# Patient Record
Sex: Female | Born: 2000 | Race: White | Hispanic: No | Marital: Single | Smoking: Former smoker
Health system: Southern US, Community
[De-identification: ages and names within clinical notes are randomized; demographics above are authoritative.]

## PROBLEM LIST (undated history)

## (undated) DIAGNOSIS — Z8669 Personal history of other diseases of the nervous system and sense organs: Secondary | ICD-10-CM

## (undated) DIAGNOSIS — R51 Headache: Secondary | ICD-10-CM

## (undated) DIAGNOSIS — R519 Headache, unspecified: Secondary | ICD-10-CM

## (undated) DIAGNOSIS — R569 Unspecified convulsions: Secondary | ICD-10-CM

## (undated) HISTORY — DX: Personal history of other diseases of the nervous system and sense organs: Z86.69

## (undated) HISTORY — PX: NO PAST SURGERIES: SHX2092

---
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
## 2018-01-04 ENCOUNTER — Other Ambulatory Visit: Payer: Self-pay | Admitting: Advanced Practice Midwife

## 2018-01-04 DIAGNOSIS — Z369 Encounter for antenatal screening, unspecified: Secondary | ICD-10-CM

## 2018-01-04 LAB — OB RESULTS CONSOLE HIV ANTIBODY (ROUTINE TESTING): HIV: NONREACTIVE

## 2018-01-05 LAB — OB RESULTS CONSOLE HEPATITIS B SURFACE ANTIGEN: Hepatitis B Surface Ag: NEGATIVE

## 2018-01-05 LAB — OB RESULTS CONSOLE RPR: RPR: NONREACTIVE

## 2018-01-26 ENCOUNTER — Ambulatory Visit
Admission: RE | Admit: 2018-01-26 | Discharge: 2018-01-26 | Disposition: A | Discharge status: Home / Self Care | Payer: Medicaid Other | Source: Ambulatory Visit | Attending: Maternal & Fetal Medicine | Admitting: Maternal & Fetal Medicine

## 2018-01-26 ENCOUNTER — Ambulatory Visit (HOSPITAL_BASED_OUTPATIENT_CLINIC_OR_DEPARTMENT_OTHER)
Admission: RE | Admit: 2018-01-26 | Discharge: 2018-01-26 | Disposition: A | Discharge status: Home / Self Care | Payer: Medicaid Other | Source: Ambulatory Visit | Attending: Maternal & Fetal Medicine | Admitting: Maternal & Fetal Medicine

## 2018-01-26 VITALS — BP 120/78 | HR 89 | Temp 98.3°F | Resp 18 | Wt 116.4 lb

## 2018-01-26 DIAGNOSIS — Z818 Family history of other mental and behavioral disorders: Secondary | ICD-10-CM | POA: Diagnosis not present

## 2018-01-26 DIAGNOSIS — Z369 Encounter for antenatal screening, unspecified: Secondary | ICD-10-CM

## 2018-01-26 DIAGNOSIS — Z3689 Encounter for other specified antenatal screening: Secondary | ICD-10-CM | POA: Diagnosis not present

## 2018-01-26 DIAGNOSIS — Z8669 Personal history of other diseases of the nervous system and sense organs: Secondary | ICD-10-CM

## 2018-01-26 DIAGNOSIS — Z3A11 11 weeks gestation of pregnancy: Secondary | ICD-10-CM | POA: Insufficient documentation

## 2018-01-26 NOTE — Progress Notes (Addendum)
Referring physician:  [facility] Length of Consultation: 40 minutes   Ms. Kinkade  was referred to [facility] for genetic counseling to review prenatal screening and testing options.  We also discussed her history of seizures and the family history of autism.  This note summarizes the information we discussed.    We offered the following routine screening tests for this pregnancy:  First trimester screening, which includes nuchal translucency ultrasound screen and first trimester maternal serum marker screening.  The nuchal translucency has approximately an 80% detection rate for Down syndrome and can be positive for other chromosome abnormalities as well as congenital heart defects.  When combined with a maternal serum marker screening, the detection rate is up to 90% for Down syndrome and up to 97% for trisomy 18.     Maternal serum marker screening, a blood test that measures pregnancy proteins, can provide risk assessments for Down syndrome, trisomy 18, and open neural tube defects (spina bifida, anencephaly). Because it does not directly examine the fetus, it cannot positively diagnose or rule out these problems.  Targeted ultrasound uses high frequency sound waves to create an image of the developing fetus.  An ultrasound is often recommended as a routine means of evaluating the pregnancy.  It is also used to screen for fetal anatomy problems (for example, a heart defect) that might be suggestive of a chromosomal or other abnormality.   Should these screening tests indicate an increased concern, then the following additional testing options would be offered:  The chorionic villus sampling procedure is available for first trimester chromosome analysis.  This involves the withdrawal of a small amount of chorionic villi (tissue from the developing placenta).  Risk of pregnancy loss is estimated to be approximately 1 in 200 to 1 in 100 (0.5 to  1%).  There is approximately a 1% (1 in 100) chance that the CVS chromosome results will be unclear.  Chorionic villi cannot be tested for neural tube defects.     Amniocentesis involves the removal of a small amount of amniotic fluid from the sac surrounding the fetus with the use of a thin needle inserted through the maternal abdomen and uterus.  Ultrasound guidance is used throughout the procedure.  Fetal cells from amniotic fluid are directly evaluated and > 99.5% of chromosome problems and > 98% of open neural tube defects can be detected. This procedure is generally performed after the 15th week of pregnancy.  The main risks to this procedure include complications leading to miscarriage in less than 1 in 200 cases (0.5%).  As another option for information if the pregnancy is suspected to be an an increased chance for certain chromosome conditions, we also reviewed the availability of cell free fetal DNA testing from maternal blood to determine whether or not the baby may have either Down syndrome, trisomy 4, or trisomy 73.  This test utilizes a maternal blood sample and DNA sequencing technology to isolate circulating cell free fetal DNA from maternal plasma.  The fetal DNA can then be analyzed for DNA sequences that are derived from the three most common chromosomes involved in aneuploidy, chromosomes 13, 18, and 21.  If the overall amount of DNA is greater than the expected level for any of these chromosomes, aneuploidy is suspected.  While we do not consider it a replacement for invasive testing and karyotype analysis, a negative result from this testing would be reassuring, though not a guarantee of a normal chromosome complement for  the baby.  An abnormal result is certainly suggestive of an abnormal chromosome complement, though we would still recommend CVS or amniocentesis to confirm any findings from this testing.  Cystic Fibrosis and Spinal Muscular Atrophy (SMA) screening were also discussed  with the patient. Both conditions are recessive, which means that both parents must be carriers in order to have a child with the disease.  Cystic fibrosis (CF) is one of the most common genetic conditions in persons of Caucasian ancestry.  This condition occurs in approximately 1 in 2,500 Caucasian persons and results in thickened secretions in the lungs, digestive, and reproductive systems.  For a baby to be at risk for having CF, both of the parents must be carriers for this condition.  Approximately 1 in 90 Caucasian persons is a carrier for CF.  Current carrier testing looks for the most common mutations in the gene for CF and can detect approximately 90% of carriers in the Caucasian population.  This means that the carrier screening can greatly reduce, but cannot eliminate, the chance for an individual to have a child with CF.  If an individual is found to be a carrier for CF, then carrier testing would be available for the partner. As part of [place]'s newborn screening profile, all babies born in the state of [place] will have a two-tier screening process.  Specimens are first tested to determine the concentration of immunoreactive trypsinogen (IRT).  The top 5% of specimens with the highest IRT values then undergo DNA testing using a panel of over 40 common CF mutations. SMA is a neurodegenerative disorder that leads to atrophy of skeletal muscle and overall weakness.  This condition is also more prevalent in the Caucasian population, with 1 in 40-1 in 60 persons being a carrier and 1 in 6,000-1 in 10,000 children being affected.  There are multiple forms of the disease, with some causing death in infancy to other forms with survival into adulthood.  The genetics of SMA is complex, but carrier screening can detect up to 95% of carriers in the Caucasian population.  Similar to CF, a negative result can greatly reduce, but cannot eliminate, the chance to have a child with SMA. Both the patient  and her partner are of mixed African American and Caucasian background.  Prior hemoglobinopathy screening was performed at [facility] and was normal.  We obtained a detailed family history and pregnancy history.  The patient reported one paternal first cousin (her aunt's daughter) with autism.  She is 19 years old and has no other health conditions or birth defects that were reported.  The term autism may include a spectrum of disorders from Asperger syndrome (with mild features) to more severe autistic features and developmental disabilities.  There may be many different causes for autism, some of which are inherited and others that are not well understood.  In order to determine the chance for this pregnancy or other family members to have a similar condition, one must first determine the cause for the condition in the affected family member.  When the specific cause is not known, this risk assessment is difficult.  Among the inherited causes for autism spectrum disorders, developmental delay or intellectual disabilities are single gene disorders, chromosome conditions, numerous syndromes and multifactorial conditions.  Each of these may have a different recurrence risk within a family.  Because there is currently no identified diagnosis in this cousin, we also discussed the availability of carrier screening for Fragile X syndrome.  Fragile X  syndrome is the most common inherited cause for mental intellectual disabilities in males and has autism spectrum disorders as a common feature.  Fragile X syndrome is caused by a change, or expansion, of the DNA in the gene FMR1.  Carrier screening is used to determine the number of repeats in this region of the gene and to determine the likelihood of having a child with this condition.  A negative result would mean that the individual undergoing carrier testing is not at risk for having a child with Fragile X, but cannot exclude other causes for autism or mental retardation.   In some situations, results from this testing may be inconclusive.  Ms. Earlene PlaterDavis declined this screening at this time.  If more is learned about this cousin and her diagnosis, we are happy to discuss this further.    The patient also reported a personal history of seizures as a child.  Her OB referred her to a neurologist for evaluation, but she did not go to that visit because she was afraid of possible testing that they may do that may hurt the baby.  I encouraged her to reschedule that visit and to go, with reassurance that they would not perform testing that would be harmful to the baby. Based upon her description, Ms. Earlene PlaterDavis had seizures where she "blacked out" as a child only when she got "overheated".  It is unclear if this means febrile seizures, which are not an uncommon condition, or if this is something different.  Of note, her mother is said to have had grand mal seizures many years ago and her maternal grandmother is also said to have had seizures, though no details are known.  None of them was known to be on medication to control seizures.  None had any neurocutaneous markers suggestive of a genetic syndrome associated with seizures.  We discussed that it is difficult to determine the chance for her child to have seizures, particularly because we do not know the cause for them in her and her family members.  However, given the reported family history, we would encourage her to make her pediatrician aware of this history, as this baby may be at increased risk for seizures.  The visit to the neurologist may help provide additional information. The remainder of the family history was reported to be unremarkable for birth defects, intellectual delays, recurrent pregnancy loss or known chromosome abnormalities.  Ms. Earlene PlaterDavis stated that this is her first pregnancy.  She reported no complications or exposures that would be expected to increase the risk for birth defects.  After consideration of the options,  Ms. Earlene PlaterDavis elected to proceed with an ultrasound only.  She declined first trimester aneuploidy screening as well as carrier screening for CF and SMA.  An ultrasound was performed at the time of the visit.  The gestational age was consistent with 11weeks 6 days.  Fetal anatomy could not be assessed due to early gestational age.  Please refer to the ultrasound report for details of that study.  Ms. Earlene PlaterDavis was encouraged to call with questions or concerns.  We can be contacted at [phone].  Labs ordered: none  Cherly Andersoneborah F. Wells, MS, CGC  I was immediately available and supervising. Argentina PonderAndra H. Sophina Mitten, MD [facility]

## 2018-03-08 DIAGNOSIS — Z87898 Personal history of other specified conditions: Secondary | ICD-10-CM | POA: Insufficient documentation

## 2018-03-13 ENCOUNTER — Other Ambulatory Visit: Payer: Self-pay | Admitting: *Deleted

## 2018-03-13 DIAGNOSIS — O99332 Smoking (tobacco) complicating pregnancy, second trimester: Secondary | ICD-10-CM

## 2018-03-16 ENCOUNTER — Ambulatory Visit
Admission: RE | Admit: 2018-03-16 | Discharge: 2018-03-16 | Disposition: A | Discharge status: Home / Self Care | Payer: Medicaid Other | Source: Ambulatory Visit | Attending: Maternal & Fetal Medicine | Admitting: Maternal & Fetal Medicine

## 2018-03-16 DIAGNOSIS — O99332 Smoking (tobacco) complicating pregnancy, second trimester: Secondary | ICD-10-CM

## 2018-04-06 ENCOUNTER — Other Ambulatory Visit: Payer: Self-pay | Admitting: Obstetrics and Gynecology

## 2018-04-06 DIAGNOSIS — F17200 Nicotine dependence, unspecified, uncomplicated: Secondary | ICD-10-CM

## 2018-04-06 DIAGNOSIS — Z87898 Personal history of other specified conditions: Secondary | ICD-10-CM

## 2018-04-10 ENCOUNTER — Ambulatory Visit
Admission: RE | Admit: 2018-04-10 | Discharge: 2018-04-10 | Disposition: A | Discharge status: Home / Self Care | Payer: Medicaid Other | Source: Ambulatory Visit | Attending: Obstetrics and Gynecology | Admitting: Obstetrics and Gynecology

## 2018-04-10 VITALS — BP 108/77 | HR 87 | Temp 97.7°F | Resp 18 | Wt 138.8 lb

## 2018-04-10 DIAGNOSIS — Z3A22 22 weeks gestation of pregnancy: Secondary | ICD-10-CM | POA: Diagnosis not present

## 2018-04-10 DIAGNOSIS — O99332 Smoking (tobacco) complicating pregnancy, second trimester: Secondary | ICD-10-CM | POA: Diagnosis present

## 2018-04-10 DIAGNOSIS — Z369 Encounter for antenatal screening, unspecified: Secondary | ICD-10-CM

## 2018-04-10 NOTE — Progress Notes (Signed)
Fetus measures small at 22 weeks lagging 8 days  F/u growth ordered in 3-4 weeks

## 2018-04-27 ENCOUNTER — Other Ambulatory Visit: Payer: Self-pay | Admitting: Obstetrics and Gynecology

## 2018-04-27 DIAGNOSIS — Z09 Encounter for follow-up examination after completed treatment for conditions other than malignant neoplasm: Secondary | ICD-10-CM

## 2018-04-27 DIAGNOSIS — Z3689 Encounter for other specified antenatal screening: Secondary | ICD-10-CM

## 2018-05-01 ENCOUNTER — Ambulatory Visit
Admission: RE | Admit: 2018-05-01 | Discharge: 2018-05-01 | Disposition: A | Discharge status: Home / Self Care | Payer: Medicaid Other | Source: Ambulatory Visit | Attending: Maternal & Fetal Medicine | Admitting: Maternal & Fetal Medicine

## 2018-05-01 DIAGNOSIS — Z3689 Encounter for other specified antenatal screening: Secondary | ICD-10-CM

## 2018-05-01 DIAGNOSIS — Z09 Encounter for follow-up examination after completed treatment for conditions other than malignant neoplasm: Secondary | ICD-10-CM

## 2018-05-01 HISTORY — DX: Headache: R51

## 2018-05-01 HISTORY — DX: Headache, unspecified: R51.9

## 2018-05-18 ENCOUNTER — Other Ambulatory Visit: Payer: Self-pay

## 2018-05-18 DIAGNOSIS — F17200 Nicotine dependence, unspecified, uncomplicated: Secondary | ICD-10-CM

## 2018-05-18 DIAGNOSIS — Z87898 Personal history of other specified conditions: Secondary | ICD-10-CM

## 2018-05-22 ENCOUNTER — Ambulatory Visit
Admission: RE | Admit: 2018-05-22 | Discharge: 2018-05-22 | Disposition: A | Discharge status: Home / Self Care | Payer: Medicaid Other | Source: Ambulatory Visit | Attending: Obstetrics & Gynecology | Admitting: Obstetrics & Gynecology

## 2018-05-22 DIAGNOSIS — Z3A28 28 weeks gestation of pregnancy: Secondary | ICD-10-CM | POA: Insufficient documentation

## 2018-05-22 DIAGNOSIS — Z87898 Personal history of other specified conditions: Secondary | ICD-10-CM | POA: Insufficient documentation

## 2018-05-22 DIAGNOSIS — O99333 Smoking (tobacco) complicating pregnancy, third trimester: Secondary | ICD-10-CM | POA: Insufficient documentation

## 2018-05-22 DIAGNOSIS — Z3689 Encounter for other specified antenatal screening: Secondary | ICD-10-CM | POA: Diagnosis not present

## 2018-05-22 DIAGNOSIS — F17200 Nicotine dependence, unspecified, uncomplicated: Secondary | ICD-10-CM | POA: Diagnosis not present

## 2018-05-26 LAB — HM HIV SCREENING LAB: HM HIV Screening: NEGATIVE

## 2018-06-15 ENCOUNTER — Other Ambulatory Visit: Payer: Self-pay | Admitting: Obstetrics and Gynecology

## 2018-06-15 DIAGNOSIS — F17200 Nicotine dependence, unspecified, uncomplicated: Secondary | ICD-10-CM

## 2018-06-15 DIAGNOSIS — Z87898 Personal history of other specified conditions: Secondary | ICD-10-CM

## 2018-06-19 ENCOUNTER — Ambulatory Visit
Admission: RE | Admit: 2018-06-19 | Discharge: 2018-06-19 | Disposition: A | Discharge status: Home / Self Care | Payer: Medicaid Other | Source: Ambulatory Visit | Attending: Obstetrics & Gynecology | Admitting: Obstetrics & Gynecology

## 2018-06-19 DIAGNOSIS — Z3A32 32 weeks gestation of pregnancy: Secondary | ICD-10-CM | POA: Diagnosis not present

## 2018-06-19 DIAGNOSIS — Z87898 Personal history of other specified conditions: Secondary | ICD-10-CM | POA: Insufficient documentation

## 2018-06-19 DIAGNOSIS — F17200 Nicotine dependence, unspecified, uncomplicated: Secondary | ICD-10-CM | POA: Insufficient documentation

## 2018-07-16 LAB — OB RESULTS CONSOLE GBS: GBS: POSITIVE

## 2018-08-02 NOTE — L&D Delivery Note (Signed)
Delivery Note At 8:39 PM a viable female was delivered via Vaginal, Spontaneous (Presentation: ROA).  APGAR: 8, 9; weight pending .   Placenta status: delivered spontaneously, intact.  Cord: 3VC, with the following complications: none.  Cord pH: n/a  Anesthesia:  none Episiotomy: None Lacerations: 1st degree Suture Repair: 3.0 vicryl Est. Blood Loss (mL): 450  Mom to postpartum.  Baby to Couplet care / Skin to Skin.  Called to see patient.  Mom pushed to deliver a viable female infant.  The head followed by shoulders, which delivered without difficulty, and the rest of the body.  No nuchal cord noted.  Baby to mom's chest.  Cord clamped and cut after > 1 min delay.  Cord blood obtained.  Placenta delivered spontaneously, intact, with a 3-vessel cord.  First degree vaginal laceration repaired with 3-0 Vicryl in standard fashion, mainly to obtain hemostasis.  All counts correct.  Hemostasis obtained with IV pitocin and fundal massage. EBL 450 mL.     Thomasene Mohair, MD 08/17/2018, 9:04 PM

## 2018-08-04 ENCOUNTER — Other Ambulatory Visit: Payer: Self-pay | Admitting: Obstetrics & Gynecology

## 2018-08-17 ENCOUNTER — Inpatient Hospital Stay
Admission: RE | Admit: 2018-08-17 | Discharge: 2018-08-19 | DRG: 807 | Disposition: A | Discharge status: Home / Self Care | Payer: Medicaid Other | Attending: Obstetrics and Gynecology | Admitting: Obstetrics and Gynecology

## 2018-08-17 ENCOUNTER — Other Ambulatory Visit: Payer: Self-pay

## 2018-08-17 DIAGNOSIS — O480 Post-term pregnancy: Secondary | ICD-10-CM

## 2018-08-17 DIAGNOSIS — O99824 Streptococcus B carrier state complicating childbirth: Secondary | ICD-10-CM | POA: Diagnosis not present

## 2018-08-17 DIAGNOSIS — Z3490 Encounter for supervision of normal pregnancy, unspecified, unspecified trimester: Secondary | ICD-10-CM | POA: Diagnosis present

## 2018-08-17 DIAGNOSIS — Z3A40 40 weeks gestation of pregnancy: Secondary | ICD-10-CM

## 2018-08-17 LAB — URINE DRUG SCREEN, QUALITATIVE (ARMC ONLY)
Amphetamines, Ur Screen: NOT DETECTED
BENZODIAZEPINE, UR SCRN: NOT DETECTED
Barbiturates, Ur Screen: NOT DETECTED
Cannabinoid 50 Ng, Ur ~~LOC~~: NOT DETECTED
Cocaine Metabolite,Ur ~~LOC~~: NOT DETECTED
MDMA (Ecstasy)Ur Screen: NOT DETECTED
Methadone Scn, Ur: NOT DETECTED
Opiate, Ur Screen: NOT DETECTED
Phencyclidine (PCP) Ur S: NOT DETECTED
TRICYCLIC, UR SCREEN: NOT DETECTED

## 2018-08-17 LAB — CBC
HCT: 38.9 % (ref 36.0–46.0)
Hemoglobin: 12.9 g/dL (ref 12.0–15.0)
MCH: 29.3 pg (ref 26.0–34.0)
MCHC: 33.2 g/dL (ref 30.0–36.0)
MCV: 88.4 fL (ref 80.0–100.0)
Platelets: 314 10*3/uL (ref 150–400)
RBC: 4.4 MIL/uL (ref 3.87–5.11)
RDW: 12.1 % (ref 11.5–15.5)
WBC: 12.9 10*3/uL — ABNORMAL HIGH (ref 4.0–10.5)
nRBC: 0 % (ref 0.0–0.2)

## 2018-08-17 LAB — TYPE AND SCREEN
ABO/RH(D): O POS
ANTIBODY SCREEN: NEGATIVE

## 2018-08-17 MED ORDER — LIDOCAINE HCL (PF) 1 % IJ SOLN
INTRAMUSCULAR | Status: AC
  Administered 2018-08-17: 30 mL
  Filled 2018-08-17: qty 30

## 2018-08-17 MED ORDER — DIPHENHYDRAMINE HCL 25 MG PO CAPS: 25.0000 mg | ORAL_CAPSULE | Freq: Four times a day (QID) | ORAL | Status: DC | PRN

## 2018-08-17 MED ORDER — OXYTOCIN 10 UNIT/ML IJ SOLN
INTRAMUSCULAR | Status: AC
  Filled 2018-08-17: qty 2

## 2018-08-17 MED ORDER — SENNOSIDES-DOCUSATE SODIUM 8.6-50 MG PO TABS
2.0000 | ORAL_TABLET | ORAL | Status: DC
  Administered 2018-08-18 – 2018-08-19 (×2): 2 via ORAL
  Filled 2018-08-17 (×2): qty 2

## 2018-08-17 MED ORDER — ACETAMINOPHEN 325 MG PO TABS: 650.0000 mg | ORAL_TABLET | ORAL | Status: DC | PRN

## 2018-08-17 MED ORDER — BUPIVACAINE LIPOSOME 1.3 % IJ SUSP
INTRAMUSCULAR | Status: AC
  Filled 2018-08-17: qty 20

## 2018-08-17 MED ORDER — CEFAZOLIN SODIUM-DEXTROSE 2-4 GM/100ML-% IV SOLN
2.0000 g | Freq: Once | INTRAVENOUS | Status: DC
  Filled 2018-08-17: qty 100

## 2018-08-17 MED ORDER — BUPIVACAINE HCL (PF) 0.5 % IJ SOLN
INTRAMUSCULAR | Status: AC
  Filled 2018-08-17: qty 30

## 2018-08-17 MED ORDER — BUTORPHANOL TARTRATE 1 MG/ML IJ SOLN
1.0000 mg | INTRAMUSCULAR | Status: DC | PRN
  Administered 2018-08-17 (×2): 1 mg via INTRAVENOUS
  Filled 2018-08-17 (×2): qty 1

## 2018-08-17 MED ORDER — SIMETHICONE 80 MG PO CHEW: 80.0000 mg | CHEWABLE_TABLET | ORAL | Status: DC | PRN

## 2018-08-17 MED ORDER — OXYTOCIN 40 UNITS IN NORMAL SALINE INFUSION - SIMPLE MED
INTRAVENOUS | Status: AC
  Administered 2018-08-17: 500 mL via INTRAVENOUS
  Filled 2018-08-17: qty 1000

## 2018-08-17 MED ORDER — SOD CITRATE-CITRIC ACID 500-334 MG/5ML PO SOLN
ORAL | Status: AC
  Filled 2018-08-17: qty 15

## 2018-08-17 MED ORDER — HYDROCODONE-ACETAMINOPHEN 5-325 MG PO TABS: 1.0000 | ORAL_TABLET | Freq: Four times a day (QID) | ORAL | Status: DC | PRN

## 2018-08-17 MED ORDER — MISOPROSTOL 200 MCG PO TABS
ORAL_TABLET | ORAL | Status: AC
  Filled 2018-08-17: qty 4

## 2018-08-17 MED ORDER — DIBUCAINE 1 % RE OINT: 1.0000 "application " | TOPICAL_OINTMENT | RECTAL | Status: DC | PRN

## 2018-08-17 MED ORDER — MISOPROSTOL 25 MCG QUARTER TABLET
25.0000 ug | ORAL_TABLET | ORAL | Status: DC | PRN
  Administered 2018-08-17 (×2): 25 ug via VAGINAL
  Filled 2018-08-17 (×3): qty 1

## 2018-08-17 MED ORDER — COCONUT OIL OIL: 1.0000 "application " | TOPICAL_OIL | Status: DC | PRN

## 2018-08-17 MED ORDER — ONDANSETRON HCL 4 MG/2ML IJ SOLN: 4.0000 mg | INTRAMUSCULAR | Status: DC | PRN

## 2018-08-17 MED ORDER — CEFAZOLIN (ANCEF) 1 G IV SOLR
2.0000 g | INTRAVENOUS | Status: DC
  Filled 2018-08-17: qty 2

## 2018-08-17 MED ORDER — BENZOCAINE-MENTHOL 20-0.5 % EX AERO: 1.0000 "application " | INHALATION_SPRAY | CUTANEOUS | Status: DC | PRN

## 2018-08-17 MED ORDER — SODIUM CHLORIDE 0.9 % IV SOLN
5.0000 10*6.[IU] | Freq: Once | INTRAVENOUS | Status: AC
  Administered 2018-08-17: 5 10*6.[IU] via INTRAVENOUS
  Filled 2018-08-17: qty 5

## 2018-08-17 MED ORDER — IBUPROFEN 600 MG PO TABS
600.0000 mg | ORAL_TABLET | Freq: Four times a day (QID) | ORAL | Status: DC
  Administered 2018-08-18 – 2018-08-19 (×6): 600 mg via ORAL
  Filled 2018-08-17 (×6): qty 1

## 2018-08-17 MED ORDER — OXYTOCIN BOLUS FROM INFUSION
500.0000 mL | Freq: Once | INTRAVENOUS | Status: AC
  Administered 2018-08-17: 500 mL via INTRAVENOUS

## 2018-08-17 MED ORDER — LACTATED RINGERS IV SOLN
500.0000 mL | INTRAVENOUS | Status: DC | PRN
  Administered 2018-08-17: 500 mL via INTRAVENOUS

## 2018-08-17 MED ORDER — FAMOTIDINE 20 MG PO TABS
20.0000 mg | ORAL_TABLET | Freq: Two times a day (BID) | ORAL | Status: DC
  Administered 2018-08-17: 20 mg via ORAL
  Filled 2018-08-17: qty 1

## 2018-08-17 MED ORDER — BENZOCAINE-MENTHOL 20-0.5 % EX AERO
INHALATION_SPRAY | CUTANEOUS | Status: AC
  Filled 2018-08-17: qty 56

## 2018-08-17 MED ORDER — FERROUS SULFATE 325 (65 FE) MG PO TABS
325.0000 mg | ORAL_TABLET | Freq: Two times a day (BID) | ORAL | Status: DC
  Administered 2018-08-18 – 2018-08-19 (×3): 325 mg via ORAL
  Filled 2018-08-17 (×3): qty 1

## 2018-08-17 MED ORDER — TERBUTALINE SULFATE 1 MG/ML IJ SOLN
0.2500 mg | Freq: Once | INTRAMUSCULAR | Status: AC | PRN
  Administered 2018-08-17: 0.25 mg via SUBCUTANEOUS
  Filled 2018-08-17: qty 1

## 2018-08-17 MED ORDER — WITCH HAZEL-GLYCERIN EX PADS: 1.0000 "application " | MEDICATED_PAD | CUTANEOUS | Status: DC | PRN

## 2018-08-17 MED ORDER — ONDANSETRON HCL 4 MG/2ML IJ SOLN: 4.0000 mg | Freq: Four times a day (QID) | INTRAMUSCULAR | Status: DC | PRN

## 2018-08-17 MED ORDER — OXYTOCIN 40 UNITS IN NORMAL SALINE INFUSION - SIMPLE MED
2.5000 [IU]/h | INTRAVENOUS | Status: DC
  Administered 2018-08-18: 2.5 [IU]/h via INTRAVENOUS
  Filled 2018-08-17: qty 1000

## 2018-08-17 MED ORDER — ONDANSETRON HCL 4 MG PO TABS: 4.0000 mg | ORAL_TABLET | ORAL | Status: DC | PRN

## 2018-08-17 MED ORDER — PENICILLIN G 3 MILLION UNITS IVPB - SIMPLE MED
3.0000 10*6.[IU] | INTRAVENOUS | Status: DC
  Filled 2018-08-17 (×7): qty 100

## 2018-08-17 MED ORDER — AMMONIA AROMATIC IN INHA
RESPIRATORY_TRACT | Status: AC
  Filled 2018-08-17: qty 10

## 2018-08-17 MED ORDER — LACTATED RINGERS IV SOLN
INTRAVENOUS | Status: DC
  Administered 2018-08-17 (×4): via INTRAVENOUS

## 2018-08-17 MED ORDER — PRENATAL MULTIVITAMIN CH
1.0000 | ORAL_TABLET | Freq: Every day | ORAL | Status: DC
  Administered 2018-08-18 – 2018-08-19 (×2): 1 via ORAL
  Filled 2018-08-17 (×2): qty 1

## 2018-08-17 NOTE — Progress Notes (Signed)
Patient is in severe pain. Refusing vaginal examinations. Offered a epidural but does not believe this will help her pain. Baby is OP and she is having severe back pain. Offered an cesarean section for maternal intolerance to labor, remote from delivery.  Discussed risks benefits in detail with patient and her mother. Patient desires a cesarean section.   Adelene Idler MD [facility], [facility] 08/17/2018 8:02 PM

## 2018-08-17 NOTE — Discharge Summary (Signed)
OB Discharge Summary     Patient Name: Charlene Combs DOB: 09/20/2000 MRN: [id number]  Date of admission: 08/17/2018 Delivering MD: Stephen Jackson, MD  Date of Delivery: 08/17/2018  Date of discharge: 08/19/2018 Admitting diagnosis: Induction of labor Intrauterine pregnancy: [redacted]w[redacted]d     Secondary diagnosis: None     Discharge diagnosis: Term Pregnancy Delivered                                                                                                Post partum procedures:none  Augmentation: Cytotec  Complications: None  Hospital course:  Induction of Labor With Vaginal Delivery   18 y.o. yo G1P0 at [redacted]w[redacted]d was admitted to the hospital 08/17/2018 for induction of labor.  Indication for induction: Postdates.  Patient had an uncomplicated labor course as follows: Membrane Rupture Time/Date: 12:35 PM ,08/17/2018   Intrapartum Procedures: Episiotomy: None [1]                                         Lacerations:  1st degree [2]  Patient had delivery of a Viable infant.  Information for the patient's newborn:  Mosely, Girl Sherrice [[id number]]  Delivery Method: Vag-Spont   08/17/2018  Details of delivery can be found in separate delivery note.  Patient had a routine postpartum course. Patient is discharged home 08/19/18.  Physical exam  Vitals:   08/18/18 1521 08/18/18 2004 08/19/18 0059 08/19/18 0718  BP: 122/72 109/67 120/81 114/78  Pulse: 80 91 96 87  Resp: 20 20 20 18  Temp: 97.9 F (36.6 C) 98.3 F (36.8 C) 98.3 F (36.8 C) 98.6 F (37 C)  TempSrc: Oral Oral Oral Oral  SpO2:  98% 98% 99%  Weight:      Height:       General: alert, cooperative and no distress Lochia: appropriate Uterine Fundus: firm/ at U/ ML/ NT Incision: Healing well with no significant drainage DVT Evaluation: No evidence of DVT seen on physical exam.  Labs: Lab Results  Component Value Date   WBC 19.7 (H) 08/18/2018   HGB 11.7 (L) 08/18/2018   HCT 35.9 (L) 08/18/2018   MCV 90.4 08/18/2018    PLT 293 08/18/2018  O POS/ Varivax x2/ MMR x2  Discharge instruction: per After Visit Summary.  Medications:  Allergies as of 08/19/2018   No Known Allergies     Medication List    TAKE these medications   prenatal multivitamin Tabs tablet Take 1 tablet by mouth daily at 12 noon.       Diet: routine diet  Activity: Advance as tolerated. Pelvic rest for 6 weeks.   Outpatient follow up: Follow-up Information    Department, [facility]. Schedule an appointment as soon as possible for a visit in 6 week(s).   Contact information: [address] [place] [address] [phone]             Postpartum contraception: Nexplanon Rhogam Given postpartum: no Rubella vaccine given postpartum: no Varicella vaccine given   postpartum: no TDaP given antepartum or postpartum: 05/26/2018  Newborn Data: Live born female  Birth Weight:  7#6.5oz APGAR: 8, 9  Newborn Delivery   Birth date/time:  08/17/2018 20:39:00 Delivery type:  Vaginal, Spontaneous      Baby Feeding: Bottle  Disposition:home with mother  SIGNED: Dalia Heading, CNM

## 2018-08-17 NOTE — H&P (Signed)
Obstetrics Admission History & Physical   Postdates Induction of Labor  HPI:  18 y.o. G1P0 @ [redacted]w[redacted]d (08/11/2018, Date entered prior to episode creation). Admitted on 08/17/2018:   Patient Active Problem List   Diagnosis Date Noted  . Post-dates pregnancy 08/17/2018  . size < dates  04/10/2018  . Family history of autism   . First trimester screening   . History of seizures as a child   . Elevated troponin 12/16/2014     Presents for post-dates induction of labor. She has not been having contractions, loss of fluid or vaginal bleeding. Endorses good fetal movement.  Prenatal care at: at [facility]. Pregnancy complicated by Group B strep. Early growth scan showed growth at 9%, last scan at 32 weeks 06/2018 growth was at the 40th percentile.  ROS: A review of systems was performed and negative, except as stated in the above HPI.  PMHx:  Past Medical History:  Diagnosis Date  . febrile seizures    At age 7  . Headache    Migraines   PSHx: History reviewed. No pertinent surgical history. Medications:  Medications Prior to Admission  Medication Sig Dispense Refill Last Dose  . Prenatal Vit-Fe Fumarate-FA (PRENATAL MULTIVITAMIN) TABS tablet Take 1 tablet by mouth daily at 12 noon.   Taking   Allergies: has No Known Allergies. OBHx:  OB History  Gravida Para Term Preterm AB Living  1         0  SAB TAB Ectopic Multiple Live Births               # Outcome Date GA Lbr Len/2nd Weight Sex Delivery Anes PTL Lv  1 Current            FHx: Negative/unremarkable except as detailed in HPI..  No family history of birth defects. Soc Hx: Never smoker, Alcohol: none and Recreational drug use: former (marijuana)  Objective:   Vitals:   08/17/18 0900  BP: 113/72  Pulse: (!) 110  Resp: 18  Temp: 98 F (36.7 C)   Constitutional: Well nourished, well developed female in no acute distress.  HEENT: normal Skin: Warm and dry.  Cardiovascular: Tachycardia, regular rhythm.   Extremity: trace  to 1+ bilateral pedal edema Respiratory: Clear to auscultation bilaterally. Normal respiratory effort Abdomen: gravid, non-tender Neuro: Cranial nerves grossly intact Psych: Alert and Oriented x3. No memory deficits. Normal mood and affect.  MS: normal gait, normal bilateral lower extremity ROM/strength/stability.  Pelvic exam: is not limited by body habitus External Genitalia, Bartholin's glands, Urethra, Skene's glands: within normal limits Vagina: within normal limits and with no blood in the vault Cervix: 0/30/-3 Uterus: Occasional contractions on tocometry   BSUS: Cephalic and normal fetal cardiac activity  EFM:FHR: 125 bpm, variability: moderate,  accelerations:  Present,  decelerations:  Absent Toco: Occasional contractions   Perinatal info:  Blood type: O positive Rubella - MMR vaccine x 2 Varicella - Varicella vaccine x 2 TDaP Given during third trimester of this pregnancy on 05/26/2018 RPR NR / HIV Neg/ HBsAg Neg   Assessment & Plan:   18 y.o. G1P0 @ [redacted]w[redacted]d, Admitted on 08/17/2018 for postdates induction of labor.    Admit for labor, Antibiotics for GBS prophylaxis, Observe for cervical change, Fetal Wellbeing Reassuring, Epidural when ready and AROM when Appropriate  Jacelyn Schmid, CNM [facility], [facility] 08/17/2018   

## 2018-08-18 LAB — CBC
HCT: 35.9 % — ABNORMAL LOW (ref 36.0–46.0)
HEMOGLOBIN: 11.7 g/dL — AB (ref 12.0–15.0)
MCH: 29.5 pg (ref 26.0–34.0)
MCHC: 32.6 g/dL (ref 30.0–36.0)
MCV: 90.4 fL (ref 80.0–100.0)
Platelets: 293 10*3/uL (ref 150–400)
RBC: 3.97 MIL/uL (ref 3.87–5.11)
RDW: 12.1 % (ref 11.5–15.5)
WBC: 19.7 10*3/uL — ABNORMAL HIGH (ref 4.0–10.5)
nRBC: 0 % (ref 0.0–0.2)

## 2018-08-18 LAB — RPR: RPR Ser Ql: REACTIVE — AB

## 2018-08-18 LAB — RPR, QUANT+TP ABS (REFLEX)
Rapid Plasma Reagin, Quant: 1:1 {titer} — ABNORMAL HIGH
T Pallidum Abs: NONREACTIVE

## 2018-08-18 MED ORDER — OXYTOCIN 40 UNITS IN NORMAL SALINE INFUSION - SIMPLE MED
INTRAVENOUS | Status: AC
  Filled 2018-08-18: qty 1000

## 2018-08-18 NOTE — Progress Notes (Signed)
Post Partum Day 1 Subjective: Doing well, no complaints.  Tolerating regular diet, pain with PO meds, voiding and ambulating without difficulty.  No CP SOB F/C N/V or leg pain No HA, change of vision, RUQ/epigastric pain  Objective: BP 110/74 (BP Location: Right Arm)   Pulse 100   Temp 98.2 F (36.8 C) (Oral)   Resp 18   Ht 5' 4"  (1.626 m)   Wt 78 kg   LMP 11/08/2017   SpO2 100%   BMI 29.52 kg/m    Physical Exam:  General: NAD CV: RRR Pulm: nl effort, CTABL Lochia: moderate Uterine Fundus: fundus firm and below umbilicus DVT Evaluation: no cords, ttp LEs   Recent Labs    08/17/18 0949 08/18/18 0652  HGB 12.9 11.7*  HCT 38.9 35.9*  WBC 12.9* 19.7*  PLT 314 293    Assessment/Plan: 18 y.o. G1P1001 postpartum day # 1  1. Continue routine postpartum care 2. O positive, MMR x2, Varivax x2 3. TDAP status given antepartum 4. Formula feeding/Contraception: Nexplanon 5. Disposition: discharge to home tomorrow   Rod Can, CNM

## 2018-08-19 NOTE — Clinical Social Work Note (Signed)
The CSW met with the patient's RN to discuss patient needs. As the patient and her infant are negative for all substances, CPS involvement in not indicated. The CSW provided a local resource list as the patient is new to parenting. The patient has adequate supports in the community including the FOB and her family. CSW is signing off. Please consult should needs arise.  Santiago Bumpers, MSW, Latanya Presser [phone]

## 2018-08-19 NOTE — Plan of Care (Signed)
Patient's vital signs stable; fundus firm; small amount rubra lochia; voiding; good appetite; good po fluids; bottle feeding infant with good technique observed; pain controlled with po motrin; fob at bedside and attentive; patient watched Period of Purple Crying DVD; good maternal-infant bonding observed.

## 2018-08-19 NOTE — Discharge Instructions (Signed)
Call your doctor for increased pain or vaginal bleeding, temperature above 100.4, depression, or concerns.  Continue prenatal vitamin and iron.  No strenuous activity or heavy lifting for 6 weeks.  No tub baths- showers only.  No intercourse, tampons, or douching for 6 weeks.  No driving for 2 weeks or while taking pain medications.

## 2018-08-19 NOTE — Progress Notes (Addendum)
Education provided on need for Influenza vaccine.  Pt declines vaccine at this time.  Pt states that she received TDaP vaccine during pregnancy.  Received MMR x2 and Varicella vaccine x2 per prenatal records, and declines these vaccines at this time.  Reed Breech, RN 08/19/2018 12:11 PM

## 2018-08-19 NOTE — Progress Notes (Signed)
Discharge instructions provided.  Pt and sig other verbalize understanding of all instructions and follow-up care.  Pt discharged to home with infant at 1436 on 08/19/18 via wheelchair by NT. Reynold Bowen, RN 08/19/2018 2:43 PM

## 2018-10-05 DIAGNOSIS — E663 Overweight: Secondary | ICD-10-CM | POA: Insufficient documentation

## 2019-03-20 DIAGNOSIS — Z8669 Personal history of other diseases of the nervous system and sense organs: Secondary | ICD-10-CM

## 2019-03-20 DIAGNOSIS — E663 Overweight: Secondary | ICD-10-CM

## 2019-03-21 ENCOUNTER — Ambulatory Visit: Payer: Medicaid Other

## 2020-02-05 ENCOUNTER — Encounter: Payer: Self-pay | Admitting: Emergency Medicine

## 2020-02-05 ENCOUNTER — Emergency Department: Payer: No Typology Code available for payment source

## 2020-02-05 ENCOUNTER — Other Ambulatory Visit: Payer: Self-pay

## 2020-02-05 ENCOUNTER — Emergency Department
Admission: EM | Admit: 2020-02-05 | Discharge: 2020-02-05 | Disposition: A | Discharge status: Home / Self Care | Payer: No Typology Code available for payment source | Attending: Emergency Medicine | Admitting: Emergency Medicine

## 2020-02-05 DIAGNOSIS — Y999 Unspecified external cause status: Secondary | ICD-10-CM | POA: Insufficient documentation

## 2020-02-05 DIAGNOSIS — S161XXA Strain of muscle, fascia and tendon at neck level, initial encounter: Secondary | ICD-10-CM | POA: Diagnosis not present

## 2020-02-05 DIAGNOSIS — S199XXA Unspecified injury of neck, initial encounter: Secondary | ICD-10-CM | POA: Diagnosis present

## 2020-02-05 DIAGNOSIS — Y929 Unspecified place or not applicable: Secondary | ICD-10-CM | POA: Diagnosis not present

## 2020-02-05 DIAGNOSIS — Y939 Activity, unspecified: Secondary | ICD-10-CM | POA: Insufficient documentation

## 2020-02-05 MED ORDER — IBUPROFEN 600 MG PO TABS
600.0000 mg | ORAL_TABLET | Freq: Three times a day (TID) | ORAL | 0 refills | Status: DC | PRN
Start: 2020-02-05 — End: 2022-02-08

## 2020-02-05 MED ORDER — CYCLOBENZAPRINE HCL 10 MG PO TABS
10.0000 mg | ORAL_TABLET | Freq: Three times a day (TID) | ORAL | 0 refills | Status: DC | PRN
Start: 2020-02-05 — End: 2022-02-08
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
## 2020-05-10 ENCOUNTER — Other Ambulatory Visit: Payer: Self-pay

## 2020-05-10 ENCOUNTER — Encounter: Payer: Self-pay | Admitting: Emergency Medicine

## 2020-05-10 ENCOUNTER — Emergency Department
Admission: EM | Admit: 2020-05-10 | Discharge: 2020-05-10 | Disposition: A | Discharge status: Home / Self Care | Payer: Medicaid Other | Attending: Emergency Medicine | Admitting: Emergency Medicine

## 2020-05-10 DIAGNOSIS — Z5321 Procedure and treatment not carried out due to patient leaving prior to being seen by health care provider: Secondary | ICD-10-CM | POA: Insufficient documentation

## 2020-05-10 DIAGNOSIS — R1110 Vomiting, unspecified: Secondary | ICD-10-CM | POA: Diagnosis not present

## 2020-05-10 DIAGNOSIS — R6883 Chills (without fever): Secondary | ICD-10-CM | POA: Insufficient documentation

## 2020-05-10 DIAGNOSIS — R109 Unspecified abdominal pain: Secondary | ICD-10-CM | POA: Diagnosis not present

## 2020-05-10 LAB — COMPREHENSIVE METABOLIC PANEL
ALT: 15 U/L (ref 0–44)
AST: 30 U/L (ref 15–41)
Albumin: 4.7 g/dL (ref 3.5–5.0)
Alkaline Phosphatase: 69 U/L (ref 38–126)
Anion gap: 12 (ref 5–15)
BUN: 12 mg/dL (ref 6–20)
CO2: 21 mmol/L — ABNORMAL LOW (ref 22–32)
Calcium: 9.9 mg/dL (ref 8.9–10.3)
Chloride: 104 mmol/L (ref 98–111)
Creatinine, Ser: 0.64 mg/dL (ref 0.44–1.00)
GFR, Estimated: >60 mL/min (ref >60)
Glucose, Bld: 123 mg/dL — ABNORMAL HIGH (ref 70–99)
Potassium: 3.1 mmol/L — ABNORMAL LOW (ref 3.5–5.1)
Sodium: 137 mmol/L (ref 135–145)
Total Bilirubin: 1.6 mg/dL — ABNORMAL HIGH (ref 0.3–1.2)
Total Protein: 8.9 g/dL — ABNORMAL HIGH (ref 6.5–8.1)

## 2020-05-10 LAB — URINALYSIS, COMPLETE (UACMP) WITH MICROSCOPIC
Bacteria, UA: NONE SEEN
Bilirubin Urine: NEGATIVE
Glucose, UA: NEGATIVE mg/dL
Ketones, ur: NEGATIVE mg/dL
Leukocytes,Ua: NEGATIVE
Nitrite: NEGATIVE
Protein, ur: 100 mg/dL — AB
Specific Gravity, Urine: 1.018 (ref 1.005–1.030)
pH: 9 — ABNORMAL HIGH (ref 5.0–8.0)

## 2020-05-10 LAB — LIPASE, BLOOD: Lipase: 38 U/L (ref 11–51)

## 2020-05-10 LAB — CBC
HCT: 39.7 % (ref 36.0–46.0)
Hemoglobin: 13.3 g/dL (ref 12.0–15.0)
MCH: 27.4 pg (ref 26.0–34.0)
MCHC: 33.5 g/dL (ref 30.0–36.0)
MCV: 81.9 fL (ref 80.0–100.0)
Platelets: 509 10*3/uL — ABNORMAL HIGH (ref 150–400)
RBC: 4.85 MIL/uL (ref 3.87–5.11)
RDW: 14.6 % (ref 11.5–15.5)
WBC: 12.5 10*3/uL — ABNORMAL HIGH (ref 4.0–10.5)
nRBC: 0 % (ref 0.0–0.2)

## 2020-05-10 LAB — POCT PREGNANCY, URINE: Preg Test, Ur: NEGATIVE

## 2020-05-10 MED ORDER — ONDANSETRON 4 MG PO TBDP
4.0000 mg | ORAL_TABLET | Freq: Once | ORAL | Status: AC | PRN
  Administered 2020-05-10: 4 mg via ORAL
  Filled 2020-05-10: qty 1

## 2020-05-10 NOTE — ED Notes (Signed)
Pt called x 3 for room with no answer.

## 2020-05-10 NOTE — ED Notes (Signed)
Pt given ginger ale to sip 

## 2020-05-10 NOTE — ED Triage Notes (Addendum)
PT arrived via POV with reports of vomiting since waking up this morning,  Pt reports she also has abdominal pain from vomiting.  Pt denies any diarrhea. Pt dry heaving in triage at this time.

## 2020-05-10 NOTE — ED Notes (Signed)
Called x1 outside and inside without answer.

## 2020-05-10 NOTE — ED Notes (Addendum)
Pt called x2 for room with no answer. 

## 2021-01-02 ENCOUNTER — Ambulatory Visit: Payer: Self-pay

## 2021-01-02 ENCOUNTER — Ambulatory Visit (LOCAL_COMMUNITY_HEALTH_CENTER): Payer: Medicaid Other | Admitting: Family Medicine

## 2021-01-02 ENCOUNTER — Encounter: Payer: Self-pay | Admitting: Family Medicine

## 2021-01-02 ENCOUNTER — Other Ambulatory Visit: Payer: Self-pay

## 2021-01-02 VITALS — BP 105/74 | Ht 64.0 in | Wt 127.2 lb

## 2021-01-02 DIAGNOSIS — Z3046 Encounter for surveillance of implantable subdermal contraceptive: Secondary | ICD-10-CM | POA: Diagnosis not present

## 2021-01-02 DIAGNOSIS — F4321 Adjustment disorder with depressed mood: Secondary | ICD-10-CM

## 2021-01-02 DIAGNOSIS — Z01419 Encounter for gynecological examination (general) (routine) without abnormal findings: Secondary | ICD-10-CM

## 2021-01-02 DIAGNOSIS — Z113 Encounter for screening for infections with a predominantly sexual mode of transmission: Secondary | ICD-10-CM

## 2021-01-02 DIAGNOSIS — Z3009 Encounter for other general counseling and advice on contraception: Secondary | ICD-10-CM | POA: Diagnosis not present

## 2021-01-02 DIAGNOSIS — B373 Candidiasis of vulva and vagina: Secondary | ICD-10-CM

## 2021-01-02 DIAGNOSIS — Z1272 Encounter for screening for malignant neoplasm of vagina: Secondary | ICD-10-CM

## 2021-01-02 DIAGNOSIS — B3731 Acute candidiasis of vulva and vagina: Secondary | ICD-10-CM

## 2021-01-02 LAB — WET PREP FOR TRICH, YEAST, CLUE
Trichomonas Exam: NEGATIVE
Yeast Exam: NEGATIVE

## 2021-01-02 MED ORDER — CLOTRIMAZOLE 1 % VA CREA: 1.0000 | TOPICAL_CREAM | Freq: Every day | VAGINAL | 0 refills | Status: AC

## 2021-01-02 NOTE — Progress Notes (Signed)
[facility] DEPARTMENT [facility] [address] Main Number: [phone]    Family Planning Visit- Initial Visit  Subjective:  Charlene Combs is a 20 y.o.  G1P1001   being seen today for an initial annual visit and to discuss contraceptive options.  The patient is currently using Nexplanon for pregnancy prevention. Patient reports she does not want a pregnancy in the next year.  Patient has the following medical conditions has Elevated troponin; Family history of autism; History of seizures as a child; and Overweight on their problem list.  No chief complaint on file.   Patient reports wants physical and Nexplanon removed.  PT reports having UTI and was treated >2 weeks ago but still having vaginal discomfort.     Body mass index is 21.84 kg/m. - Patient is eligible for diabetes screening based on BMI and age >52?  not applicable HA1C ordered? not applicable  Patient reports 1  partner/s in last year. Desires STI screening?  Yes  Has patient been screened once for HCV in the past?  No  No results found for: HCVAB  Does the patient have current drug use (including MJ), have a partner with drug use, and/or has been incarcerated since last result? No  If yes-- Screen for HCV through [facility]   Does the patient meet criteria for HBV testing? No  Criteria:  -Household, sexual or needle sharing contact with HBV -History of drug use -HIV positive -Those with known Hep C   Health Maintenance Due  Topic Date Due  . COVID-19 Vaccine (1) Never done  . Pneumococcal Vaccine 30-61 Years old (1 of 2 - PPSV23) Never done  . HPV VACCINES (1 - 2-dose series) Never done  . Hepatitis C Screening  Never done  . CHLAMYDIA SCREENING  12/18/2018  . PAP-Cervical Cytology Screening  Never done  . PAP SMEAR-Modifier  Never done    Review of Systems  Constitutional: Negative for chills, fever, malaise/fatigue and weight loss.  HENT: Negative for  congestion, hearing loss and sore throat.   Eyes: Negative for blurred vision, double vision and photophobia.  Respiratory: Negative for shortness of breath.   Cardiovascular: Negative for chest pain.  Gastrointestinal: Negative for abdominal pain, blood in stool, constipation, diarrhea, heartburn, nausea and vomiting.  Genitourinary: Negative for dysuria and frequency.  Musculoskeletal: Negative for back pain, joint pain and neck pain.  Skin: Negative for itching and rash.  Neurological: Negative for dizziness, weakness and headaches.  Endo/Heme/Allergies: Does not bruise/bleed easily.  Psychiatric/Behavioral: Negative for depression, substance abuse and suicidal ideas.    The following portions of the patient's history were reviewed and updated as appropriate: allergies, current medications, past family history, past medical history, past social history, past surgical history and problem list. Problem list updated.   See flowsheet for other program required questions.  Objective:   Vitals:   01/02/21 1445  BP: 105/74  Weight: 127 lb 4 oz (57.7 kg)  Height: 5\' 4"  (1.626 m)    Physical Exam Vitals and nursing note reviewed.  Constitutional:      Appearance: Normal appearance.  HENT:     Head: Normocephalic and atraumatic.     Mouth/Throat:     Mouth: Mucous membranes are moist.     Pharynx: No oropharyngeal exudate or posterior oropharyngeal erythema.  Eyes:     General: No scleral icterus. Cardiovascular:     Rate and Rhythm: Normal rate.     Pulses: Normal pulses.  Pulmonary:     Effort: Pulmonary effort is normal.  Abdominal:     General: Abdomen is flat. Bowel sounds are normal.     Palpations: Abdomen is soft.  Genitourinary:    General: Normal vulva.     Vagina: Vaginal discharge present.     Rectum: Normal.     Comments: External genitalia without, lice, nits, erythema, edema , lesions or inguinal adenopathy. Vagina with normal mucosa and thick curd like white   discharge and pH > 4.  Cervix without visual lesions, uterus firm, mobile, non-tender, no masses, CMT adnexal fullness or tenderness.   Musculoskeletal:        General: Normal range of motion.  Skin:    General: Skin is warm and dry.  Neurological:     General: No focal deficit present.     Mental Status: She is alert.       Assessment and Plan:  Charlene Combs is a 20 y.o. female presenting to the [facility] Department for an initial annual wellness/contraceptive visit   1. Smear, vaginal, as part of routine gynecological examination Well woman exam  CBE today  Pap today   - Pap IG (Image Guided)  Contraception counseling: Reviewed all forms of birth control options in the tiered based approach. available including abstinence; over the counter/barrier methods; hormonal contraceptive medication including pill, patch, ring, injection,contraceptive implant, ECP; hormonal and nonhormonal IUDs; permanent sterilization options including vasectomy and the various tubal sterilization modalities. Risks, benefits, and typical effectiveness rates were reviewed.  Questions were answered.  Written information was also given to the patient to review.  Patient desires no method, this was prescribed for patient. She will follow up in as needed for surveillance.  She was told to call with any further questions, or with any concerns about this method of contraception.  Emphasized use of condoms 100% of the time for STI prevention.  Patient was not offered ECP based on pt declined.     2. Screening examination for venereal disease  - Chlamydia/Gonorrhea [facility] - WET PREP FOR TRICH, YEAST, CLUE  3. Nexplanon removal  Patient identified, informed consent performed, consent signed.   Appropriate time out taken. Nexplanon site identified.  Area prepped in usual sterile fashon. 3 ml of 1% lidocaine with Epinephrine was used to anesthetize the area at the distal end of the implant  and along implant site. A small stab incision was made right beside the implant on the distal portion.  The Nexplanon rod was grasped using hemostats and removed without difficulty.  There was minimal blood loss. There were no complications.  Steri-strips were applied over the small incision.  A pressure bandage was applied to reduce any bruising.  The patient tolerated the procedure well and was given post procedure instructions.    4. Yeast vaginitis Yeast visualized on assessment  - clotrimazole (V-R CLOTRIMAZOLE VAGINAL) 1 % vaginal cream; Place 1 Applicatorful vaginally at bedtime for 7 days.  Dispense: 45 g; Refill: 0  5. Situational depression Pt PHQ-9 was 20.  Pt declines referral to [facility].  Pt reports denies self harm or harm to others.  She is just going through a situation but will be fine.  Hazeline Junker and  Cardinal card given      No follow-ups on file.  No future appointments.  Wendi Snipes, FNP

## 2021-01-02 NOTE — Progress Notes (Signed)
PE and nexplanon removal done at [facility]. Wet mount reviewed by provider. All orders completed.

## 2021-01-07 LAB — PAP IG (IMAGE GUIDED): PAP Smear Comment: 0
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
## 2021-06-11 ENCOUNTER — Ambulatory Visit: Payer: Medicaid Other

## 2021-06-24 ENCOUNTER — Ambulatory Visit: Payer: Medicaid Other

## 2021-08-02 NOTE — L&D Delivery Note (Signed)
Delivery Note  First Stage: Labor onset: 2395 Augmentation : pitocin Analgesia /Anesthesia intrapartum: epidural SROM at 1345  Second Stage: Complete dilation at 0016 Onset of pushing at 0020 FHR second stage: 115-120bpm, moderate variability  Delivery of a viable female infant 05/06/2022 at 0026 by Lucrezia Europe, CNM. delivery of fetal head in OA position with restitution to LOT. No nuchal cord;  Anterior then posterior shoulders delivered easily with gentle downward traction. Baby placed on mom's chest, and attended to by peds.  Cord double clamped after cessation of pulsation, cut by FOB Cord blood sample collected   Third Stage: Placenta delivered Delena Bali intact with 3 VC @ 0032 Placenta disposition: discarded Uterine tone firm / bleeding scant  no laceration identified  Anesthesia for repair: n/a Repair none Est. Blood Loss (mL): 320EB  Complications: none  Mom to postpartum.  Baby to Couplet care / Skin to Skin.  Newborn: Birth Weight: TBD, infant skin-to-skin  Apgar Scores: 8, 9 Feeding planned: breastfeeding

## 2021-08-31 ENCOUNTER — Ambulatory Visit (LOCAL_COMMUNITY_HEALTH_CENTER): Payer: Medicaid Other

## 2021-08-31 ENCOUNTER — Other Ambulatory Visit: Payer: Self-pay

## 2021-08-31 VITALS — BP 116/80 | Ht 64.0 in | Wt 128.5 lb

## 2021-08-31 DIAGNOSIS — Z3201 Encounter for pregnancy test, result positive: Secondary | ICD-10-CM

## 2021-08-31 LAB — PREGNANCY, URINE: Preg Test, Ur: POSITIVE — AB

## 2021-08-31 MED ORDER — PRENATAL 27-0.8 MG PO TABS: 1.0000 | ORAL_TABLET | Freq: Every day | ORAL | 0 refills | Status: AC

## 2021-08-31 NOTE — Progress Notes (Signed)
UPT positive. Plans prenatal care at [facility]. To clerk for preadmit. Chizara Mena, RN  

## 2021-10-12 ENCOUNTER — Encounter: Payer: Self-pay | Admitting: Nurse Practitioner

## 2021-10-12 ENCOUNTER — Other Ambulatory Visit: Payer: Self-pay

## 2021-10-12 ENCOUNTER — Other Ambulatory Visit: Payer: Self-pay | Admitting: Nurse Practitioner

## 2021-10-12 ENCOUNTER — Ambulatory Visit: Payer: Medicaid Other | Admitting: Nurse Practitioner

## 2021-10-12 VITALS — BP 105/69 | HR 79 | Temp 97.2°F | Wt 137.6 lb

## 2021-10-12 DIAGNOSIS — Z3480 Encounter for supervision of other normal pregnancy, unspecified trimester: Secondary | ICD-10-CM | POA: Insufficient documentation

## 2021-10-12 DIAGNOSIS — Z3481 Encounter for supervision of other normal pregnancy, first trimester: Secondary | ICD-10-CM

## 2021-10-12 DIAGNOSIS — B379 Candidiasis, unspecified: Secondary | ICD-10-CM

## 2021-10-12 DIAGNOSIS — Z8669 Personal history of other diseases of the nervous system and sense organs: Secondary | ICD-10-CM

## 2021-10-12 DIAGNOSIS — F1290 Cannabis use, unspecified, uncomplicated: Secondary | ICD-10-CM | POA: Insufficient documentation

## 2021-10-12 HISTORY — DX: Encounter for supervision of other normal pregnancy, unspecified trimester: Z34.80

## 2021-10-12 LAB — URINALYSIS
Bilirubin, UA: NEGATIVE
Glucose, UA: NEGATIVE
Ketones, UA: NEGATIVE
Nitrite, UA: NEGATIVE
Protein,UA: NEGATIVE
RBC, UA: NEGATIVE
Specific Gravity, UA: 1.025 (ref 1.005–1.030)
Urobilinogen, Ur: 0.2 mg/dL (ref 0.2–1.0)
pH, UA: 7 (ref 5.0–7.5)

## 2021-10-12 LAB — WET PREP FOR TRICH, YEAST, CLUE: Trichomonas Exam: NEGATIVE

## 2021-10-12 LAB — HEMOGLOBIN, FINGERSTICK: Hemoglobin: 12.7 g/dL (ref 11.1–15.9)

## 2021-10-12 MED ORDER — CLOTRIMAZOLE 1 % VA CREA: 1.0000 | TOPICAL_CREAM | Freq: Every day | VAGINAL | 0 refills | Status: AC

## 2021-10-12 NOTE — Progress Notes (Signed)
Wet mount and other in house labs reviewed with provider. Patient treated per SO for yeast. Hgb=12.7. Dental list given. U/S ordered to be faxed when received from provider. Patient declined flu vaccine. Burt Knack, RN  ?

## 2021-10-12 NOTE — Progress Notes (Signed)
Patient here for new OB visit at 11 6/7. FOB is in waiting area and would like to be called in for heartbeat. Patient lives with FOB and with her daughter.Burt Knack, RN  ?

## 2021-10-12 NOTE — Progress Notes (Signed)
[facility] Department  ?Maternal Health Clinic ? ? ?INITIAL PRENATAL VISIT NOTE ? ?Subjective:  ?Charlene Combs is a 21 y.o. G2P1001 at [redacted]w[redacted]d being seen today to start prenatal care at the [facility].  She is currently monitored for the following issues for this low-risk pregnancy and has Elevated troponin; Family history of autism; History of seizures as a child; Overweight; and Supervision of other normal pregnancy, antepartum on their problem list. ? ?Patient reports no complaints.  Contractions: Not present. Vag. Bleeding: None.  Movement: Absent. Denies leaking of fluid.  ? ?Indications for ASA therapy (per uptodate) ?One of the following: ?Previous pregnancy with preeclampsia, especially early onset and with an adverse outcome No ?Multifetal gestation No ?Chronic hypertension No ?Type 1 or 2 diabetes mellitus No ?Chronic kidney disease No ?Autoimmune disease (antiphospholipid syndrome, systemic lupus erythematosus) No ? ?Two or more of the following: ?Nulliparity No ?Obesity (body mass index >30 kg/m2) No ?Family history of preeclampsia in mother or sister No ?Age ?35 years No ?Sociodemographic characteristics (African American race, low socioeconomic level) Yes ?Personal risk factors (eg, previous pregnancy with low birth weight or small for gestational age infant, previous adverse pregnancy outcome [eg, stillbirth], interval >10 years between pregnancies) No ? ? ?The following portions of the patient's history were reviewed and updated as appropriate: allergies, current medications, past family history, past medical history, past social history, past surgical history and problem list. Problem list updated. ? ?Objective:  ? ?Vitals:  ? 10/12/21 1352  ?BP: 105/69  ?Pulse: 79  ?Temp: (!) 97.2 ?F (36.2 ?C)  ?Weight: 137 lb 9.6 oz (62.4 kg)  ? ? ?Fetal Status: Fetal Heart Rate (bpm): 150   Movement: Absent  Presentation: Undeterminable ? ? ?Physical Exam ?Vitals and nursing note  reviewed.  ?Constitutional:   ?   General: She is not in acute distress. ?   Appearance: Normal appearance. She is well-developed.  ?HENT:  ?   Head: Normocephalic and atraumatic.  ?   Right Ear: External ear normal.  ?   Left Ear: External ear normal.  ?   Nose: Nose normal. No congestion or rhinorrhea.  ?   Mouth/Throat:  ?   Lips: Pink.  ?   Mouth: Mucous membranes are moist.  ?   Dentition: Normal dentition. No dental caries.  ?   Pharynx: Oropharynx is clear. Uvula midline.  ?   Comments: Dentition: No visible signs of dental caries.   ?Eyes:  ?   General: No scleral icterus. ?   Conjunctiva/sclera: Conjunctivae normal.  ?   Pupils: Pupils are equal, round, and reactive to light.  ?Neck:  ?   Thyroid: No thyroid mass or thyromegaly.  ?Cardiovascular:  ?   Rate and Rhythm: Normal rate and regular rhythm.  ?   Pulses: Normal pulses.  ?   Comments: Extremities are warm and well perfused ?Pulmonary:  ?   Effort: Pulmonary effort is normal.  ?   Breath sounds: Normal breath sounds.  ?Chest:  ?   Chest wall: No mass.  ?Breasts: ?   Tanner Score is 5.  ?   Breasts are symmetrical.  ?   Right: Normal. No mass, nipple discharge or skin change.  ?   Left: Normal. No mass, nipple discharge or skin change.  ?   Comments: Breasts:  ?      Right: Normal. No swelling, mass, nipple discharge, skin change or tenderness.  ?      Left: Normal. No  swelling, mass, nipple discharge, skin change or tenderness.   ?Abdominal:  ?   General: Abdomen is flat. Bowel sounds are normal.  ?   Palpations: Abdomen is soft.  ?   Tenderness: There is no abdominal tenderness.  ?   Comments: Gravid   ?Genitourinary: ?   General: Normal vulva.  ?   Exam position: Lithotomy position.  ?   Pubic Area: No rash.   ?   Labia:     ?   Right: No rash.     ?   Left: No rash.   ?   Vagina: Normal. No vaginal discharge.  ?   Cervix: No cervical motion tenderness or friability.  ?   Uterus: Normal. Enlarged (Gravid 11 size). Not tender.   ?   Adnexa: Right  adnexa normal and left adnexa normal.  ?   Rectum: Normal. No external hemorrhoid.  ?   Comments: External genitalia/pubic area without nits, lice, edema, erythema, lesions and inguinal adenopathy. ?Vagina with normal mucosa and discharge. ?Cervix without visible lesions. ?Uterus firm, mobile, nt, no masses, no CMT, no adnexal tenderness or fullness. pH 4.5.  Yeast present, along with vaginal irritation.  ?Musculoskeletal:  ?   Cervical back: Full passive range of motion without pain, normal range of motion and neck supple.  ?   Right lower leg: No edema.  ?   Left lower leg: No edema.  ?Lymphadenopathy:  ?   Cervical: No cervical adenopathy.  ?   Upper Body:  ?   Right upper body: No axillary adenopathy.  ?   Left upper body: No axillary adenopathy.  ?Skin: ?   General: Skin is warm and dry.  ?   Capillary Refill: Capillary refill takes less than 2 seconds.  ?Neurological:  ?   Mental Status: She is alert and oriented to person, place, and time.  ?Psychiatric:     ?   Attention and Perception: Attention normal.     ?   Mood and Affect: Mood normal.     ?   Speech: Speech normal.     ?   Behavior: Behavior is cooperative.  ? ? ?Assessment and Plan:  ?Pregnancy: G2P1001 at [redacted]w[redacted]d ? ?1. Supervision of other normal pregnancy, antepartum ?-21 year old female in clinic today for initial prenatal visit.  ?-Patient reports taking PNV daily.  ?-Currently lives with child and FOB.  Patient is currently not working. ?-Patient states last LMP was 07/21/21.  Will still send patient for initial U/S.   ?-Last PAP 6/22.  Next PAP due 01/2024. ?-History of MJ use.  Last used 11/22.  ?-Last dental exam was 2018.  Informed patient about the importance and safety of regularly scheduled dental exams during pregnancy.  ?-Patient denies having a COVID vaccine.  Flu vaccine offered today and patient declined.  ?-PHQ9-1 ?-Hgb today 12.7. ?-Patient offered genetic counseling.  Patient declined.  ? ?- Chlamydia/GC NAA, Confirmation ?-  HIV-1/HIV-2 Qualitative RNA ?- Prenatal profile without Varicella or Rubella ?- HCV Ab w Reflex to Quant PCR ?- Urine Culture ?- Hemoglobin, venipuncture ?- Urinalysis (Urine Dip) ?- WET PREP FOR TRICH, YEAST, CLUE ?- [id number] 7+Oxycodone-Bund ?- clotrimazole (GYNE-LOTRIMIN) 1 % vaginal cream; Place 1 Applicatorful vaginally at bedtime for 7 days.  Dispense: 45 g; Refill: 0 ? ?2. Yeast detected ?-Yeast present on wet mount.  Patient treated for yeast.   ?- clotrimazole (GYNE-LOTRIMIN) 1 % vaginal cream; Place 1 Applicatorful vaginally at bedtime for 7 days.  Dispense: 45  g; Refill: 0  ? ? ?3. Marijuana use ?-History of MJ use, last used 06/2021.  UDS today.  ? ?4. History of seizure as a child ?-Past reports that last seizure was many years ago when she was a child.  Will continue to monitor.   ? ? ? ?Discussed overview of care and coordination with inpatient delivery practices including [facility], [facility], [facility] and [facility].  ? ? ?Term labor symptoms and general obstetric precautions including but not limited to vaginal bleeding, contractions, leaking of fluid and fetal movement were reviewed in detail with the patient. ? ?Please refer to After Visit Summary for other counseling recommendations.  ? ?Return in about 4 weeks (around 11/09/2021) for Routine prenatal care visit. ? ?Future Appointments  ?Date Time Provider [place]  ?11/09/2021  3:00 PM [facility] PROVIDER [facility] None  ? ? ?  ? ?  ?Gregary Cromer, FNP ?

## 2021-10-13 LAB — HCV AB W REFLEX TO QUANT PCR: HCV Ab: NONREACTIVE

## 2021-10-13 LAB — CBC/D/PLT+RPR+RH+ABO+AB SCR
Antibody Screen: NEGATIVE
Basophils Absolute: 0.1 10*3/uL (ref 0.0–0.2)
Basos: 1 %
EOS (ABSOLUTE): 0.1 10*3/uL (ref 0.0–0.4)
Eos: 1 %
Hematocrit: 37 % (ref 34.0–46.6)
Hemoglobin: 12.5 g/dL (ref 11.1–15.9)
Hepatitis B Surface Ag: NEGATIVE
Immature Grans (Abs): 0 10*3/uL (ref 0.0–0.1)
Immature Granulocytes: 0 %
Lymphocytes Absolute: 1.9 10*3/uL (ref 0.7–3.1)
Lymphs: 21 %
MCH: 29.9 pg (ref 26.6–33.0)
MCHC: 33.8 g/dL (ref 31.5–35.7)
MCV: 89 fL (ref 79–97)
Monocytes Absolute: 0.8 10*3/uL (ref 0.1–0.9)
Monocytes: 8 %
Neutrophils Absolute: 6.5 10*3/uL (ref 1.4–7.0)
Neutrophils: 69 %
Platelets: 331 10*3/uL (ref 150–450)
RBC: 4.18 x10E6/uL (ref 3.77–5.28)
RDW: 12.5 % (ref 11.7–15.4)
RPR Ser Ql: NONREACTIVE
Rh Factor: POSITIVE
WBC: 9.3 10*3/uL (ref 3.4–10.8)

## 2021-10-13 LAB — 789231 7+OXYCODONE-BUND
Amphetamines, Urine: NEGATIVE ng/mL
BENZODIAZ UR QL: NEGATIVE ng/mL
Barbiturate screen, urine: NEGATIVE ng/mL
Cannabinoid Quant, Ur: NEGATIVE ng/mL
Cocaine (Metab.): NEGATIVE ng/mL
OPIATE SCREEN URINE: NEGATIVE ng/mL
Oxycodone/Oxymorphone, Urine: NEGATIVE ng/mL
PCP Quant, Ur: NEGATIVE ng/mL

## 2021-10-13 LAB — HCV INTERPRETATION

## 2021-10-14 LAB — HIV-1/HIV-2 QUALITATIVE RNA
HIV-1 RNA, Qualitative: NONREACTIVE
HIV-2 RNA, Qualitative: NONREACTIVE

## 2021-10-14 LAB — URINE CULTURE

## 2021-10-14 LAB — CHLAMYDIA/GC NAA, CONFIRMATION
Chlamydia trachomatis, NAA: NEGATIVE
Neisseria gonorrhoeae, NAA: NEGATIVE

## 2021-10-15 ENCOUNTER — Telehealth: Payer: Self-pay

## 2021-10-15 NOTE — Telephone Encounter (Signed)
Call to [facility] to ascertain if [place] appt scheduled (referral faxed 10/12/21). Per receptionist, appt has been scheduled for 10/19/2021 with arrival time of 1515. As scheduled, she said client should be aware of appt. Jossie Ng, RN ? ?

## 2021-10-23 ENCOUNTER — Encounter: Payer: Self-pay | Admitting: Advanced Practice Midwife

## 2021-11-09 ENCOUNTER — Ambulatory Visit: Payer: Medicaid Other | Admitting: Advanced Practice Midwife

## 2021-11-09 VITALS — BP 107/71 | HR 84 | Temp 97.9°F | Wt 148.6 lb

## 2021-11-09 DIAGNOSIS — Z818 Family history of other mental and behavioral disorders: Secondary | ICD-10-CM

## 2021-11-09 DIAGNOSIS — F1290 Cannabis use, unspecified, uncomplicated: Secondary | ICD-10-CM

## 2021-11-09 DIAGNOSIS — E663 Overweight: Secondary | ICD-10-CM

## 2021-11-09 DIAGNOSIS — Z3482 Encounter for supervision of other normal pregnancy, second trimester: Secondary | ICD-10-CM

## 2021-11-09 DIAGNOSIS — Z3480 Encounter for supervision of other normal pregnancy, unspecified trimester: Secondary | ICD-10-CM

## 2021-11-09 NOTE — Progress Notes (Signed)
[facility] Department ?Maternal Health Clinic ? ?PRENATAL VISIT NOTE ? ?Subjective:  ?Charlene Combs is a 21 y.o. G2P1001 at [redacted]w[redacted]d being seen today for ongoing prenatal care.  She is currently monitored for the following issues for this low-risk pregnancy and has Elevated troponin; Family history of autism; History of seizures as a child; Overweight; Supervision of other normal pregnancy, antepartum; and Marijuana use on their problem list. ? ?Patient reports no complaints.  Contractions: Not present. Vag. Bleeding: None.  Movement: Absent. Denies leaking of fluid/ROM.  ? ?The following portions of the patient's history were reviewed and updated as appropriate: allergies, current medications, past family history, past medical history, past social history, past surgical history and problem list. Problem list updated. ? ?Objective:  ? ?Vitals:  ? 11/09/21 1500  ?BP: 107/71  ?Pulse: 84  ?Temp: 97.9 ?F (36.6 ?C)  ?Weight: 148 lb 9.6 oz (67.4 kg)  ? ? ?Fetal Status: Fetal Heart Rate (bpm): 150 Fundal Height: 16 cm Movement: Absent    ? ?General:  Alert, oriented and cooperative. Patient is in no acute distress.  ?Skin: Skin is warm and dry. No rash noted.   ?Cardiovascular: Normal heart rate noted  ?Respiratory: Normal respiratory effort, no problems with respiration noted  ?Abdomen: Soft, gravid, appropriate for gestational age.  Pain/Pressure: Absent     ?Pelvic: Cervical exam deferred        ?Extremities: Normal range of motion.  Edema: None  ?Mental Status: Normal mood and affect. Normal behavior. Normal judgment and thought content.  ? ?Assessment and Plan:  ?Pregnancy: G2P1001 at [redacted]w[redacted]d ? ?1. Supervision of other normal pregnancy, antepartum ?Not working ?Not in school ?Living with FOB and her 66 yo daughter ?Wants Quad screen today ?01/02/21 pap neg ?Had u/s 10/19/21 at 12 5/7--not in computer--RN to call [place] [facility] to send to [place] ?- AFP TETRA ? ?2. Marijuana use ?Denies use ? ?3. Overweight ?28 lb 9.6 oz (13  kg) ?Not exercising--encouraged to do so 3x/wk x 20 min ?4. Family history of autism ?Declines genetic counseling ? ? ?Preterm labor symptoms and general obstetric precautions including but not limited to vaginal bleeding, contractions, leaking of fluid and fetal movement were reviewed in detail with the patient. ?Please refer to After Visit Summary for other counseling recommendations.  ?No follow-ups on file. ? ?No future appointments. ? ?Herbie Saxon, CNM ? ?

## 2021-11-09 NOTE — Progress Notes (Signed)
Patient here for MH RV at 15 6/7. Desires quad screen today.Burt Knack, RN  ?

## 2021-11-11 LAB — AFP TETRA
DIA Mom Value: 1.3
DIA Value (EIA): 208.15 pg/mL
DSR (By Age)    1 IN: 1103
DSR (Second Trimester) 1 IN: 3509
Gestational Age: 15.6 WEEKS
MSAFP Mom: 0.48
MSAFP: 15.9 ng/mL
MSHCG Mom: 0.67
MSHCG: 32044 m[IU]/mL
Maternal Age At EDD: 23 yr
Osb Risk: 10000
T18 (By Age): 1:4296 {titer}
Test Results:: NEGATIVE
Weight: 149 [lb_av]
uE3 Mom: 0.83
uE3 Value: 0.71 ng/mL

## 2021-11-11 NOTE — Addendum Note (Signed)
Addended by: Heywood Bene on: 11/11/2021 10:29 AM ? ? Modules accepted: Orders ? ?

## 2021-12-07 ENCOUNTER — Telehealth: Payer: Self-pay

## 2021-12-07 ENCOUNTER — Ambulatory Visit: Payer: Medicaid Other

## 2021-12-07 NOTE — Telephone Encounter (Signed)
Per Epic appt desk, client now has [facility] RV appt scheduled for 12/14/2021. Jossie Ng, RN ? ?

## 2021-12-07 NOTE — Telephone Encounter (Signed)
[facility] for [place] RV 12/07/2021. Call to client and per recorded message, calling restrictions prevent completion of call. Call to mother (emergency contact) and left message requesting she contact client and have her reschedule missed [facility] appt as RN unable to contact her. Number to call provided. Rich Number, RN ? ?

## 2021-12-14 ENCOUNTER — Ambulatory Visit: Payer: Medicaid Other | Admitting: Nurse Practitioner

## 2021-12-14 ENCOUNTER — Encounter: Payer: Self-pay | Admitting: Nurse Practitioner

## 2021-12-14 VITALS — BP 112/64 | HR 86 | Temp 98.5°F | Wt 159.8 lb

## 2021-12-14 DIAGNOSIS — F1290 Cannabis use, unspecified, uncomplicated: Secondary | ICD-10-CM

## 2021-12-14 DIAGNOSIS — Z3480 Encounter for supervision of other normal pregnancy, unspecified trimester: Secondary | ICD-10-CM

## 2021-12-14 DIAGNOSIS — Z3482 Encounter for supervision of other normal pregnancy, second trimester: Secondary | ICD-10-CM

## 2021-12-14 DIAGNOSIS — E663 Overweight: Secondary | ICD-10-CM
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
## 2021-12-14 NOTE — Progress Notes (Signed)
Counseled AFT Tetra testing results negative. Jossie Ng, RN ? ?

## 2022-01-11 ENCOUNTER — Ambulatory Visit: Payer: Medicaid Other | Admitting: Family Medicine

## 2022-01-11 VITALS — BP 105/61 | HR 85 | Temp 97.9°F | Wt 172.8 lb

## 2022-01-11 DIAGNOSIS — Z3480 Encounter for supervision of other normal pregnancy, unspecified trimester: Secondary | ICD-10-CM

## 2022-01-11 DIAGNOSIS — E663 Overweight: Secondary | ICD-10-CM

## 2022-01-11 NOTE — Progress Notes (Signed)
[facility] Health Department Maternal Health Clinic  PRENATAL VISIT NOTE  Subjective:  Charlene Combs is a 21 y.o. G2P1001 at [redacted]w[redacted]d being seen today for ongoing prenatal care.  She is currently monitored for the following issues for this low-risk pregnancy and has Elevated troponin; Family history of autism; History of seizures as a child; Overweight; Supervision of other normal pregnancy, antepartum; and Marijuana use on their problem list.  Patient reports no complaints.  Contractions: Not present. Vag. Bleeding: None.  Movement: Present. Denies leaking of fluid/ROM.   The following portions of the patient's history were reviewed and updated as appropriate: allergies, current medications, past family history, past medical history, past social history, past surgical history and problem list. Problem list updated.  Objective:   Vitals:   01/11/22 1313  BP: 105/61  Pulse: 85  Temp: 97.9 F (36.6 C)  Weight: 172 lb 12.8 oz (78.4 kg)    Fetal Status: Fetal Heart Rate (bpm): 145 Fundal Height: 25 cm Movement: Present     General:  Alert, oriented and cooperative. Patient is in no acute distress.  Skin: Skin is warm and dry. No rash noted.   Cardiovascular: Normal heart rate noted  Respiratory: Normal respiratory effort, no problems with respiration noted  Abdomen: Soft, gravid, appropriate for gestational age.  Pain/Pressure: Absent     Pelvic: Cervical exam deferred        Extremities: Normal range of motion.  Edema: Trace  Mental Status: Normal mood and affect. Normal behavior. Normal judgment and thought content.   Assessment and Plan:  Pregnancy: G2P1001 at [redacted]w[redacted]d  1. Supervision of other normal pregnancy, antepartum TWG - 52 lbs  Taking PNV as directed  Needs Anatomy US- order sent today  -discussed normal symptoms of pregnancy such as ligament pain.   PTL discussed by RN  Anticipated guidance -next appointment 28 week labs and visit frequency changes to every 2  weeks.   2. Overweight Discussed lower cards and sugars  Increasing water intake  Pt is walking daily for 30 min- 1 hr. Encouraged to continue.  Discussed exceeded weight gain for pregnancy as r/t BMI.        Preterm labor symptoms and general obstetric precautions including but not limited to vaginal bleeding, contractions, leaking of fluid and fetal movement were reviewed in detail with the patient. Please refer to After Visit Summary for other counseling recommendations.  Return in about 4 weeks (around 02/08/2022) for routine prenatal care, 28 week labs.  Future Appointments  Date Time Provider Department Center  02/08/2022  1:20 PM [facility] PROVIDER [facility] None    Wendi Snipes, FNP

## 2022-01-11 NOTE — Progress Notes (Signed)
PTL Handout given.

## 2022-01-14 ENCOUNTER — Telehealth: Payer: Self-pay

## 2022-01-14 NOTE — Telephone Encounter (Signed)
TC to patient to remind her of her [place] U/S on 01/19/22 at 3:00pm. Patient aware of time and date and denies questions. Burt Knack, RN

## 2022-01-27 ENCOUNTER — Encounter: Payer: Self-pay | Admitting: Advanced Practice Midwife

## 2022-02-08 ENCOUNTER — Ambulatory Visit: Payer: Medicaid Other | Admitting: Advanced Practice Midwife

## 2022-02-08 ENCOUNTER — Encounter: Payer: Self-pay | Admitting: Advanced Practice Midwife

## 2022-02-08 VITALS — BP 104/64 | HR 79 | Temp 97.8°F | Wt 178.0 lb

## 2022-02-08 DIAGNOSIS — Z3483 Encounter for supervision of other normal pregnancy, third trimester: Secondary | ICD-10-CM

## 2022-02-08 DIAGNOSIS — O99019 Anemia complicating pregnancy, unspecified trimester: Secondary | ICD-10-CM

## 2022-02-08 DIAGNOSIS — Z3480 Encounter for supervision of other normal pregnancy, unspecified trimester: Secondary | ICD-10-CM

## 2022-02-08 DIAGNOSIS — F1290 Cannabis use, unspecified, uncomplicated: Secondary | ICD-10-CM

## 2022-02-08 DIAGNOSIS — O99013 Anemia complicating pregnancy, third trimester: Secondary | ICD-10-CM

## 2022-02-08 HISTORY — DX: Anemia complicating pregnancy, unspecified trimester: O99.019

## 2022-02-08 LAB — URINALYSIS
Bilirubin, UA: NEGATIVE
Glucose, UA: NEGATIVE
Ketones, UA: NEGATIVE
Nitrite, UA: NEGATIVE
Protein,UA: NEGATIVE
RBC, UA: NEGATIVE
Specific Gravity, UA: 1.03 (ref 1.005–1.030)
Urobilinogen, Ur: 0.2 mg/dL (ref 0.2–1.0)
pH, UA: 5.5 (ref 5.0–7.5)

## 2022-02-08 LAB — HEMOGLOBIN, FINGERSTICK: Hemoglobin: 10.6 g/dL — ABNORMAL LOW (ref 11.1–15.9)

## 2022-02-08 LAB — WET PREP FOR TRICH, YEAST, CLUE: Trichomonas Exam: NEGATIVE

## 2022-02-08 MED ORDER — CLOTRIMAZOLE 1 % VA CREA: 1.0000 | TOPICAL_CREAM | Freq: Every day | VAGINAL | 0 refills | Status: DC

## 2022-02-08 MED ORDER — FERROUS SULFATE 325 (65 FE) MG PO TABS: 325.0000 mg | ORAL_TABLET | Freq: Every day | ORAL | 3 refills | Status: DC

## 2022-02-08 NOTE — Progress Notes (Signed)
[facility] Health Department Maternal Health Clinic  PRENATAL VISIT NOTE  Subjective:  Charlene Combs is a 21 y.o. G2P1001 at [redacted]w[redacted]d being seen today for ongoing prenatal care.  She is currently monitored for the following issues for this low-risk pregnancy and has Elevated troponin; Family history of autism; History of seizures as a child; Supervision of other normal pregnancy, antepartum; and Marijuana use on their problem list.  Patient reports vaginal irritation.  Contractions: Not present. Vag. Bleeding: None.  Movement: Present. Denies leaking of fluid/ROM.   The following portions of the patient's history were reviewed and updated as appropriate: allergies, current medications, past family history, past medical history, past social history, past surgical history and problem list. Problem list updated.  Objective:   Vitals:   02/08/22 1320  BP: 104/64  Pulse: 79  Temp: 97.8 F (36.6 C)  Weight: 178 lb (80.7 kg)    Fetal Status: Fetal Heart Rate (bpm): 135 Fundal Height: 27 cm Movement: Present     General:  Alert, oriented and cooperative. Patient is in no acute distress.  Skin: Skin is warm and dry. No rash noted.   Cardiovascular: Normal heart rate noted  Respiratory: Normal respiratory effort, no problems with respiration noted  Abdomen: Soft, gravid, appropriate for gestational age.  Pain/Pressure: Absent     Pelvic: Cervical exam deferred        Extremities: Normal range of motion.  Edema: None  Mental Status: Normal mood and affect. Normal behavior. Normal judgment and thought content.   Assessment and Plan:  Pregnancy: G2P1001 at [redacted]w[redacted]d  1. Supervision of other normal pregnancy, antepartum Working 20 hrs/wk Walking 7x/wk x 30 min 58 lb (26.3 kg) 1 hour glucola today C/o external vaginal itching with increased white d/c x 4 days--wet mount done with erythematous vagina, white curdy leukorrhea, ph<4.5 Reviewed 01/19/22 u/s at 25 6/7 with EFW=25%, 3VC, anatomy  wnl, posterior placenta Here with 3 yo daughter  - Glucose, 1 hour - HIV-1/HIV-2 Qualitative RNA - RPR - Hemoglobin, venipuncture - Urinalysis (Urine Dip) - WET PREP FOR TRICH, YEAST, CLUE  2. Marijuana use Denies use; agrees to UDS today - 341962 Drug Screen   Preterm labor symptoms and general obstetric precautions including but not limited to vaginal bleeding, contractions, leaking of fluid and fetal movement were reviewed in detail with the patient. Please refer to After Visit Summary for other counseling recommendations.  Return in about 2 weeks (around 02/22/2022) for routine PNC.  No future appointments.  Alberteen Spindle, CNM

## 2022-02-08 NOTE — Progress Notes (Signed)
Patient due to have GTT 1 Hr Lab drawn at 2:07 pm;  completed glucola at 1:07 pm. VIS TDAp information sheet given.  The patient was dispensed Clotrimazole Vaginal Cream and Ferrous Sulfate today. I provided counseling today regarding the medication. We discussed the medication, the side effects and when to call clinic. Patient given the opportunity to ask questions. Questions answered. Anemia in Pregnancy pamphlet given. Urine Dip results reviewed by provider at time of visit. Delynn Flavin RN

## 2022-02-09 LAB — FE+CBC/D/PLT+TIBC+FER+RETIC
Basophils Absolute: 0 10*3/uL (ref 0.0–0.2)
Basos: 0 %
EOS (ABSOLUTE): 0.1 10*3/uL (ref 0.0–0.4)
Eos: 1 %
Ferritin: 7 ng/mL — ABNORMAL LOW (ref 15–150)
Hematocrit: 31.5 % — ABNORMAL LOW (ref 34.0–46.6)
Hemoglobin: 10.4 g/dL — ABNORMAL LOW (ref 11.1–15.9)
Immature Grans (Abs): 0 10*3/uL (ref 0.0–0.1)
Immature Granulocytes: 0 %
Iron Saturation: 4 % — CL (ref 15–55)
Iron: 21 ug/dL — ABNORMAL LOW (ref 27–159)
Lymphocytes Absolute: 1.6 10*3/uL (ref 0.7–3.1)
Lymphs: 17 %
MCH: 28.8 pg (ref 26.6–33.0)
MCHC: 33 g/dL (ref 31.5–35.7)
MCV: 87 fL (ref 79–97)
Monocytes Absolute: 0.8 10*3/uL (ref 0.1–0.9)
Monocytes: 9 %
Neutrophils Absolute: 6.9 10*3/uL (ref 1.4–7.0)
Neutrophils: 73 %
Platelets: 366 10*3/uL (ref 150–450)
RBC: 3.61 x10E6/uL — ABNORMAL LOW (ref 3.77–5.28)
RDW: 11.8 % (ref 11.7–15.4)
Retic Ct Pct: 2.3 % (ref 0.6–2.6)
Total Iron Binding Capacity: 511 ug/dL — ABNORMAL HIGH (ref 250–450)
UIBC: 490 ug/dL — ABNORMAL HIGH (ref 131–425)
WBC: 9.5 10*3/uL (ref 3.4–10.8)

## 2022-02-10 LAB — 789231 7+OXYCODONE-BUND
Amphetamines, Urine: NEGATIVE ng/mL
BENZODIAZ UR QL: NEGATIVE ng/mL
Barbiturate screen, urine: NEGATIVE ng/mL
Cannabinoid Quant, Ur: NEGATIVE ng/mL
Cocaine (Metab.): NEGATIVE ng/mL
OPIATE SCREEN URINE: NEGATIVE ng/mL
Oxycodone/Oxymorphone, Urine: NEGATIVE ng/mL
PCP Quant, Ur: NEGATIVE ng/mL

## 2022-02-10 LAB — RPR: RPR Ser Ql: NONREACTIVE

## 2022-02-10 LAB — HIV-1/HIV-2 QUALITATIVE RNA
HIV-1 RNA, Qualitative: NONREACTIVE
HIV-2 RNA, Qualitative: NONREACTIVE

## 2022-02-10 LAB — GLUCOSE, 1 HOUR GESTATIONAL: Gestational Diabetes Screen: 118 mg/dL (ref 70–139)

## 2022-02-22 ENCOUNTER — Ambulatory Visit: Payer: Medicaid Other | Admitting: Advanced Practice Midwife

## 2022-02-22 VITALS — BP 113/76 | HR 97 | Temp 97.0°F | Wt 181.0 lb

## 2022-02-22 DIAGNOSIS — Z3480 Encounter for supervision of other normal pregnancy, unspecified trimester: Secondary | ICD-10-CM

## 2022-02-22 DIAGNOSIS — O99013 Anemia complicating pregnancy, third trimester: Secondary | ICD-10-CM

## 2022-02-22 DIAGNOSIS — F1290 Cannabis use, unspecified, uncomplicated: Secondary | ICD-10-CM

## 2022-02-22 DIAGNOSIS — Z3483 Encounter for supervision of other normal pregnancy, third trimester: Secondary | ICD-10-CM

## 2022-02-22 NOTE — Progress Notes (Signed)
Completed course of Clotrimozole.

## 2022-02-22 NOTE — Progress Notes (Signed)
[facility] Health Department Maternal Health Clinic  PRENATAL VISIT NOTE  Subjective:  Charlene Combs is a 21 y.o. G2P1001 at [redacted]w[redacted]d being seen today for ongoing prenatal care.  She is currently monitored for the following issues for this low-risk pregnancy and has Elevated troponin; Family history of autism; History of seizures as a child; Supervision of other normal pregnancy, antepartum; Marijuana use; and Anemia affecting pregnancy on their problem list.  Patient reports no complaints.  Contractions: Not present. Vag. Bleeding: None.  Movement: Present. Denies leaking of fluid/ROM.   The following portions of the patient's history were reviewed and updated as appropriate: allergies, current medications, past family history, past medical history, past social history, past surgical history and problem list. Problem list updated.  Objective:   Vitals:   02/22/22 1508  BP: 113/76  Pulse: 97  Temp: (!) 97 F (36.1 C)  Weight: 181 lb (82.1 kg)    Fetal Status: Fetal Heart Rate (bpm): 160 Fundal Height: 31 cm Movement: Present     General:  Alert, oriented and cooperative. Patient is in no acute distress.  Skin: Skin is warm and dry. No rash noted.   Cardiovascular: Normal heart rate noted  Respiratory: Normal respiratory effort, no problems with respiration noted  Abdomen: Soft, gravid, appropriate for gestational age.  Pain/Pressure: Absent     Pelvic: Cervical exam deferred        Extremities: Normal range of motion.  Edema: None  Mental Status: Normal mood and affect. Normal behavior. Normal judgment and thought content.   Assessment and Plan:  Pregnancy: G2P1001 at [redacted]w[redacted]d  1. Anemia affecting pregnancy in third trimester Taking FeSo4 I daily with oj  2. Supervision of other normal pregnancy, antepartum Working 20 hrs/wk Walking 7x/wk x 15-20 min 1 hour glucola=118 on 02/08/22 Has car seat and crib  3. Marijuana use Denies use   Preterm labor symptoms and general  obstetric precautions including but not limited to vaginal bleeding, contractions, leaking of fluid and fetal movement were reviewed in detail with the patient. Please refer to After Visit Summary for other counseling recommendations.  Return in about 2 weeks (around 03/08/2022) for routine PNC.  No future appointments.  Alberteen Spindle, CNM

## 2022-03-08 ENCOUNTER — Ambulatory Visit: Payer: Medicaid Other

## 2022-03-17 ENCOUNTER — Ambulatory Visit: Payer: Medicaid Other | Admitting: Nurse Practitioner

## 2022-03-17 ENCOUNTER — Encounter: Payer: Self-pay | Admitting: Nurse Practitioner

## 2022-03-17 VITALS — BP 101/73 | HR 95 | Temp 97.9°F | Wt 188.0 lb

## 2022-03-17 DIAGNOSIS — Z3483 Encounter for supervision of other normal pregnancy, third trimester: Secondary | ICD-10-CM

## 2022-03-17 DIAGNOSIS — F1290 Cannabis use, unspecified, uncomplicated: Secondary | ICD-10-CM

## 2022-03-17 DIAGNOSIS — O99013 Anemia complicating pregnancy, third trimester: Secondary | ICD-10-CM

## 2022-03-17 DIAGNOSIS — Z3480 Encounter for supervision of other normal pregnancy, unspecified trimester: Secondary | ICD-10-CM

## 2022-03-17 LAB — WET PREP FOR TRICH, YEAST, CLUE
Trichomonas Exam: NEGATIVE
Yeast Exam: NEGATIVE

## 2022-03-17 LAB — HEMOGLOBIN, FINGERSTICK: Hemoglobin: 10.1 g/dL — ABNORMAL LOW (ref 11.1–15.9)

## 2022-03-17 NOTE — Progress Notes (Signed)
[facility]  PRENATAL VISIT NOTE  Subjective:  Charlene Combs is a 21 y.o. G2P1001 at [redacted]w[redacted]d being seen today for ongoing prenatal care.  She is currently monitored for the following issues for this low-risk pregnancy and has Elevated troponin; Family history of autism; History of seizures as a child; Supervision of other normal pregnancy, antepartum; Marijuana use; and Anemia affecting pregnancy on their problem list.  Patient reports  vaginal itching .  Contractions: Irritability. Vag. Bleeding: None.  Movement: Present. Denies leaking of fluid/ROM.   The following portions of the patient's history were reviewed and updated as appropriate: allergies, current medications, past family history, past medical history, past social history, past surgical history and problem list. Problem list updated.  Objective:   Vitals:   03/17/22 1333  BP: 123/72  Pulse: 95  Temp: 97.9 F (36.6 C)  Weight: 188 lb (85.3 kg)    Fetal Status: Fetal Heart Rate (bpm): 145 Fundal Height: 33 cm Movement: Present     General:  Alert, oriented and cooperative. Patient is in no acute distress.  Skin: Skin is warm and dry. No rash noted.   Cardiovascular: Normal heart rate noted  Respiratory: Normal respiratory effort, no problems with respiration noted  Abdomen: Soft, gravid, appropriate for gestational age.  Pain/Pressure: Absent     Pelvic: Cervical exam deferred        Extremities: Normal range of motion.  Edema: None  Mental Status: Normal mood and affect. Normal behavior. Normal judgment and thought content.   Assessment and Plan:  Pregnancy: G2P1001 at [redacted]w[redacted]d  1. Anemia affecting pregnancy in third trimester -Patient states she is taking Iron once a day, separate from juice.  Advised to incorporate iron rich foods. -Hgb check today.  10.1 today.  Patient states at times when stressed or having the desire to smoke she will eat ice. Advised patient to limit  the amount of ice she eats.    - Hemoglobin, fingerstick  2. Marijuana use Denies use  3. Supervision of other normal pregnancy, antepartum -21 year old female in clinic today for prenatal care. -Patient taking PNV daily. -Patient with complaints of vaginal itching.  Will obtain a wet prep today.  -Patient given kick count cards along with instructions and importance of use.   -68 lb (30.8 kg).  Reviewed with patient that she had a 7 lb weight gain since last visit.  Advised to increase physical activity, limit carbs, sugars, and fat and increase water intake.  -Wet prep negative today.  Advised patient to:  Wear all-cotton underwear Sleep without underwear Take showers instead of baths Wear loose fitting clothing, especially during warm/hot weather Use a hair dryer on low after bathing to dry the area Avoid scented soaps and body washes Stop smoking   - WET PREP FOR TRICH, YEAST, CLUE     Term labor symptoms and general obstetric precautions including but not limited to vaginal bleeding, contractions, leaking of fluid and fetal movement were reviewed in detail with the patient. Please refer to After Visit Summary for other counseling recommendations.   Return in about 2 weeks (around 03/31/2022) for Routine prenatal care visit.    Glenna Fellows, FNP

## 2022-03-17 NOTE — Progress Notes (Signed)
Patient here for MH RV at 34 1/7. Kick counts reviewed with client. Needs Hgb recheck today.Burt Knack, RN

## 2022-03-17 NOTE — Progress Notes (Signed)
Wet mount reviewed with provider. Hgb=10.1 today, counseled to continue iron 1x/day with vitamin C rich juice and to take separately from her PNV. Patient states understanding. Will need recheck of Hgb in 1 month.Burt Knack, RN

## 2022-03-31 ENCOUNTER — Ambulatory Visit: Payer: Medicaid Other | Admitting: Advanced Practice Midwife

## 2022-03-31 VITALS — BP 104/70 | HR 90 | Temp 97.7°F | Wt 189.2 lb

## 2022-03-31 DIAGNOSIS — F1290 Cannabis use, unspecified, uncomplicated: Secondary | ICD-10-CM

## 2022-03-31 DIAGNOSIS — B379 Candidiasis, unspecified: Secondary | ICD-10-CM

## 2022-03-31 DIAGNOSIS — O99013 Anemia complicating pregnancy, third trimester: Secondary | ICD-10-CM

## 2022-03-31 DIAGNOSIS — Z3483 Encounter for supervision of other normal pregnancy, third trimester: Secondary | ICD-10-CM

## 2022-03-31 DIAGNOSIS — Z3480 Encounter for supervision of other normal pregnancy, unspecified trimester: Secondary | ICD-10-CM

## 2022-03-31 MED ORDER — CLOTRIMAZOLE 1 % VA CREA: 1.0000 | TOPICAL_CREAM | Freq: Every day | VAGINAL | 0 refills | Status: AC

## 2022-03-31 NOTE — Progress Notes (Signed)
[facility] Department Maternal Health Clinic  PRENATAL VISIT NOTE  Subjective:  Charlene Combs is a 21 y.o. G2P1001 at [redacted]w[redacted]d being seen today for ongoing prenatal care.  She is currently monitored for the following issues for this low-risk pregnancy and has Elevated troponin; Family history of autism; History of seizures as a child; Supervision of other normal pregnancy, antepartum; Marijuana use; and Anemia affecting pregnancy on their problem list.  Patient reports vaginal irritation.  Contractions: Not present. Vag. Bleeding: None.  Movement: Present. Denies leaking of fluid/ROM.   The following portions of the patient's history were reviewed and updated as appropriate: allergies, current medications, past family history, past medical history, past social history, past surgical history and problem list. Problem list updated.  Objective:  There were no vitals filed for this visit.  Fetal Status: Fetal Heart Rate (bpm): 130 Fundal Height: 36 cm Movement: Present  Presentation: Vertex  General:  Alert, oriented and cooperative. Patient is in no acute distress.  Skin: Skin is warm and dry. No rash noted.   Cardiovascular: Normal heart rate noted  Respiratory: Normal respiratory effort, no problems with respiration noted  Abdomen: Soft, gravid, appropriate for gestational age.  Pain/Pressure: Absent     Pelvic: Cervical exam deferred        Extremities: Normal range of motion.  Edema: None  Mental Status: Normal mood and affect. Normal behavior. Normal judgment and thought content.   Assessment and Plan:  Pregnancy: G2P1001 at [redacted]w[redacted]d  1. Supervision of other normal pregnancy, antepartum 68 lb (30.8 kg) Denies ice pica Has car seat, crib and ready for baby at home Knows when to go to L&D Working 20 hrs/wk Walking 7x/wk x 30 min Denies cigs, vaping, cigars, MJ GC/Chlamydia/GBS cultures done C/o external vaginal irritation x 1 week--wet mount done with erythematous vagina  with white clumpy leukorrhea - GBS Culture - Chlamydia/GC NAA, Confirmation - Urinalysis (Urine Dip) - [id number] Drug Screen  2. Anemia affecting pregnancy in third trimester Taking FeS04 I daily with oj  3. Marijuana use Denies use; agrees to UDS  - GBS Culture - Chlamydia/GC NAA, Confirmation - Urinalysis (Urine Dip) - [id number] Drug Screen   Preterm labor symptoms and general obstetric precautions including but not limited to vaginal bleeding, contractions, leaking of fluid and fetal movement were reviewed in detail with the patient. Please refer to After Visit Summary for other counseling recommendations.  Return in about 1 week (around 04/07/2022) for routine PNC.  No future appointments.  Alberteen Spindle, CNM

## 2022-03-31 NOTE — Progress Notes (Signed)
Pt presents today at 36.1 weeks; denies any issues at this time; states baby is moving well and doing daily kick counts; 36 week packet given and reviewed.Florina Ou, RN

## 2022-03-31 NOTE — Progress Notes (Signed)
Wet prep reviewed, patient treated for yeast per SO. Provider aware. Patient states she has had several yeast infections during this pregnancy. Reviewed with patient other measures to help reduce likelihood of yeast infection. Patient states she has made many of these changes already and will try using a replaceable panty liner.Burt Knack, RN

## 2022-03-31 NOTE — Addendum Note (Signed)
Addended by: Burt Knack on: 03/31/2022 02:46 PM   Modules accepted: Orders

## 2022-03-31 NOTE — Addendum Note (Signed)
Addended by: Armanie Ullmer on: 03/31/2022 04:17 PM   Modules accepted: Orders  

## 2022-03-31 NOTE — Addendum Note (Signed)
Addended by: Arnetha Courser on: 03/31/2022 02:20 PM   Modules accepted: Orders

## 2022-03-31 NOTE — Addendum Note (Signed)
Addended by: Florina Ou on: 03/31/2022 02:27 PM   Modules accepted: Orders

## 2022-04-01 LAB — URINALYSIS
Bilirubin, UA: POSITIVE — AB
Glucose, UA: NEGATIVE
Ketones, UA: NEGATIVE
Nitrite, UA: NEGATIVE
RBC, UA: NEGATIVE
Specific Gravity, UA: 1.025 (ref 1.005–1.030)
Urobilinogen, Ur: 2 mg/dL — ABNORMAL HIGH (ref 0.2–1.0)
pH, UA: 6.5 (ref 5.0–7.5)

## 2022-04-01 LAB — 789231 7+OXYCODONE-BUND
Amphetamines, Urine: NEGATIVE ng/mL
BENZODIAZ UR QL: NEGATIVE ng/mL
Barbiturate screen, urine: NEGATIVE ng/mL
Cannabinoid Quant, Ur: NEGATIVE ng/mL
Cocaine (Metab.): NEGATIVE ng/mL
OPIATE SCREEN URINE: NEGATIVE ng/mL
Oxycodone/Oxymorphone, Urine: NEGATIVE ng/mL
PCP Quant, Ur: NEGATIVE ng/mL

## 2022-04-01 LAB — WET PREP FOR TRICH, YEAST, CLUE: Trichomonas Exam: NEGATIVE

## 2022-04-02 LAB — URINE CULTURE

## 2022-04-02 LAB — CHLAMYDIA/GC NAA, CONFIRMATION
Chlamydia trachomatis, NAA: NEGATIVE
Neisseria gonorrhoeae, NAA: NEGATIVE

## 2022-04-04 ENCOUNTER — Encounter: Payer: Self-pay | Admitting: Advanced Practice Midwife

## 2022-04-04 LAB — CULTURE, BETA STREP (GROUP B ONLY): Strep Gp B Culture: NEGATIVE

## 2022-04-08 ENCOUNTER — Ambulatory Visit: Payer: Medicaid Other | Admitting: Physician Assistant

## 2022-04-08 ENCOUNTER — Encounter: Payer: Self-pay | Admitting: Physician Assistant

## 2022-04-08 VITALS — BP 108/74 | HR 118 | Temp 97.9°F | Wt 190.2 lb

## 2022-04-08 DIAGNOSIS — Z3483 Encounter for supervision of other normal pregnancy, third trimester: Secondary | ICD-10-CM

## 2022-04-08 DIAGNOSIS — Z3480 Encounter for supervision of other normal pregnancy, unspecified trimester: Secondary | ICD-10-CM

## 2022-04-08 DIAGNOSIS — O99013 Anemia complicating pregnancy, third trimester: Secondary | ICD-10-CM

## 2022-04-08 DIAGNOSIS — F1290 Cannabis use, unspecified, uncomplicated: Secondary | ICD-10-CM

## 2022-04-08 NOTE — Progress Notes (Signed)
[employer] faxed order for breast pump for order to be signed by provider. Order signed by Hazle Coca, CNM. This has been faxed with order and signature to [phone]. Ok confirmation received.   Earlyne Iba, RN

## 2022-04-08 NOTE — Progress Notes (Signed)
Correctly verbalizes how to take iron and PNV. Taking iron tablet with orange juice. States completed all of vaginal medicine given last week for yeast infection. Jossie Ng, RN

## 2022-04-08 NOTE — Progress Notes (Signed)
[facility] Department M[facility] Health Clinic  PRENATAL VISIT NOTE  Subjective:  Charlene Combs is a 21 y.o. G2P1001 at [redacted]w[redacted]d being seen today for ongoing prenatal care.  She is currently monitored for the following issues for this low-risk pregnancy and has Elevated troponin; Family history of autism; History of seizures as a child; Supervision of other normal pregnancy, antepartum; Marijuana use; and Anemia affecting pregnancy on their problem list.  Patient reports no complaints.  Contractions: Not present. Vag. Bleeding: None.  Movement: Present. Denies leaking of fluid/ROM.   The following portions of the patient's history were reviewed and updated as appropriate: allergies, current medications, past family history, past medical history, past social history, past surgical history and problem list. Problem list updated.  Objective:   Vitals:   04/08/22 1341  BP: 108/74  Pulse: (!) 118  Temp: 97.9 F (36.6 C)  Weight: 190 lb 3.2 oz (86.3 kg)    Fetal Status: Fetal Heart Rate (bpm): 148 Fundal Height: 37 cm Movement: Present  Presentation: Vertex  General:  Alert, oriented and cooperative. Patient is in no acute distress.  Skin: Skin is warm and dry. No rash noted.   Cardiovascular: Normal heart rate noted  Respiratory: Normal respiratory effort, no problems with respiration noted  Abdomen: Soft, gravid, appropriate for gestational age.  Pain/Pressure: Absent     Pelvic: Cervical exam deferred        Extremities: Normal range of motion.  Edema: None  Mental Status: Normal mood and affect. Normal behavior. Normal judgment and thought content.   Assessment and Plan:  Pregnancy: G2P1001 at [redacted]w[redacted]d  1. Supervision of other normal pregnancy, antepartum Breastfeeding education provided, Rx for breast pump completed at pt request. Enc po hydration (mostly water) in light of very hot weather.  2. Anemia affecting pregnancy in third trimester Continue oral iron  supplement.  3. Marijuana use Pt states last marijuana use prior to pregnancy. UDS neg 03/31/22. Encouraged ongoing abstinence.    Term labor symptoms and general obstetric precautions including but not limited to vaginal bleeding, contractions, leaking of fluid and fetal movement were reviewed in detail with the patient. Please refer to After Visit Summary for other counseling recommendations.  Return in about 1 week (around 04/15/2022) for Routine prenatal care.  No future appointments.  Landry Dyke, PA-C

## 2022-04-08 NOTE — Progress Notes (Signed)
[employer] breast pump order faxed with ok confirmation.   Earlyne Iba, RN

## 2022-04-15 ENCOUNTER — Ambulatory Visit: Payer: Medicaid Other

## 2022-04-16 ENCOUNTER — Telehealth: Payer: Self-pay

## 2022-04-16 NOTE — Telephone Encounter (Signed)
Patient responded to the MyChart  message sent today saying to schedule her for the next available appointment.  She reports her daughter being sick and she was sorry she missed her last appointment.  New appointment has been scheduled for Friday 04-23-22 at 9:20 (arrival time is 9 am).  Jossie Ng, RN assisted with the clinical scheduling due to Epic scheduling issues.  Patient notified via MyChart message.  Hart Carwin, RN

## 2022-04-16 NOTE — Telephone Encounter (Signed)
[facility] for MHC RV 04/15/2022. Call to client and left message requesting she reschedule appt. Number to call provided. Jossie Ng, RN

## 2022-04-16 NOTE — Telephone Encounter (Signed)
MyChart message sent to patient today asking her to please call our office at [phone] to reschedule her MH RV and to send me a message through her MyChart of the new appointment day/time.  Last patient login 04-16-2022.  Hart Carwin, RN

## 2022-04-23 ENCOUNTER — Ambulatory Visit: Payer: Medicaid Other | Admitting: Advanced Practice Midwife

## 2022-04-23 VITALS — BP 116/80 | HR 120 | Temp 97.4°F | Wt 193.6 lb

## 2022-04-23 DIAGNOSIS — O99013 Anemia complicating pregnancy, third trimester: Secondary | ICD-10-CM

## 2022-04-23 DIAGNOSIS — Z3480 Encounter for supervision of other normal pregnancy, unspecified trimester: Secondary | ICD-10-CM

## 2022-04-23 DIAGNOSIS — Z3483 Encounter for supervision of other normal pregnancy, third trimester: Secondary | ICD-10-CM

## 2022-04-23 LAB — HEMOGLOBIN, FINGERSTICK: Hemoglobin: 10.8 g/dL — ABNORMAL LOW (ref 11.1–15.9)

## 2022-04-23 NOTE — Progress Notes (Signed)
[place] Department M[facility] Clinic  PRENATAL VISIT NOTE  Subjective:  Charlene Combs is a 21 y.o. G2P1001 at [redacted]w[redacted]d being seen today for ongoing prenatal care.  She is currently monitored for the following issues for this low-risk pregnancy and has Elevated troponin; Family history of autism; History of seizures as a child; Supervision of other normal pregnancy, antepartum; Marijuana use; and Anemia affecting pregnancy on their problem list.  Patient reports no complaints.  Contractions: Not present. Vag. Bleeding: None.  Movement: Present. Denies leaking of fluid/ROM.   The following portions of the patient's history were reviewed and updated as appropriate: allergies, current medications, past family history, past medical history, past social history, past surgical history and problem list. Problem list updated.  Objective:   Vitals:   04/23/22 0922  BP: 116/80  Pulse: (!) 120  Temp: (!) 97.4 F (36.3 C)  Weight: 193 lb 9.6 oz (87.8 kg)    Fetal Status: Fetal Heart Rate (bpm): 135 Fundal Height: 37 cm Movement: Present  Presentation: Vertex  General:  Alert, oriented and cooperative. Patient is in no acute distress.  Skin: Skin is warm and dry. No rash noted.   Cardiovascular: Normal heart rate noted  Respiratory: Normal respiratory effort, no problems with respiration noted  Abdomen: Soft, gravid, appropriate for gestational age.  Pain/Pressure: Absent     Pelvic: Cervical exam deferred        Extremities: Normal range of motion.  Edema: None  Mental Status: Normal mood and affect. Normal behavior. Normal judgment and thought content.   Assessment and Plan:  Pregnancy: G2P1001 at [redacted]w[redacted]d  1. Anemia during pregnancy in third trimester Taking FeSo4 BID with oj - Hemoglobin, venipuncture  2. Supervision of other normal pregnancy, antepartum 73 lb 9.6 oz (33.4 kg) Stopped working at 37 wks Walking 7x/wk x 30 min IOL paperwork completed, pt desires IOL  05/04/22 Has car seat, crib, ready for baby at home Knows when to go to L&D   Term labor symptoms and general obstetric precautions including but not limited to vaginal bleeding, contractions, leaking of fluid and fetal movement were reviewed in detail with the patient. Please refer to After Visit Summary for other counseling recommendations.  No follow-ups on file.  No future appointments.  Herbie Saxon, CNM

## 2022-04-23 NOTE — Progress Notes (Addendum)
Correctly verbalizes how to take iron and prenatal vitamin. Taking iron with juice. Rich Number, RN Hgb = 10.8 and to continue iron daily. Rich Number, RN IOL referral, snapshot pages, overview box and today's provider notes faxed to [facility] with confirmation received (records faxed 11 days prior to requested IOL date of 05/04/2022). Rich Number, RN

## 2022-04-27 ENCOUNTER — Telehealth: Payer: Self-pay

## 2022-04-27 ENCOUNTER — Other Ambulatory Visit: Payer: Self-pay | Admitting: Certified Nurse Midwife

## 2022-04-27 ENCOUNTER — Inpatient Hospital Stay: Admit: 2022-04-27 | Payer: Self-pay

## 2022-04-27 DIAGNOSIS — Z3490 Encounter for supervision of normal pregnancy, unspecified, unspecified trimester: Secondary | ICD-10-CM

## 2022-04-27 NOTE — Progress Notes (Signed)
G2P1001 at [redacted]w[redacted]d LMP of 07/21/2021, c/w early [place] 176w5d Scheduled for induction of labor for post-dates on 05/05/2022.   Prenatal provider: [facility] Pregnancy complicated by: History of seizures as a child Family history of autism Marijuana use Anemia Elevated troponin   Prenatal Labs: Blood type/Rh O pos  Antibody screen neg  Rubella MMR x2 (07/20/00, 03/10/04)  Varicella Varivax x2 (07/20/00, 08/22/08)  RPR NR  HBsAg Neg  HIV NR  GC neg  Chlamydia neg  Genetic screening Quad screen: 11/09/21 negative  1 hour GTT 118  3 hour GTT N/a  GBS Neg   Pap smear: 01/02/21 Tdap: 02/08/22 Flu: declined Contraception: condoms Feeding preference: breast  ____ DaGertie FeyCNM  Certified Nurse Midwife [facility]

## 2022-04-27 NOTE — Telephone Encounter (Signed)
Call to [facility] to ascertain if IOL appt scheduled (referral with records faxed 04/23/2022). Per Glenard Haring, appt has not yet been scheduled. Rich Number, RN

## 2022-04-30 ENCOUNTER — Telehealth: Payer: Self-pay

## 2022-04-30 ENCOUNTER — Ambulatory Visit: Payer: Medicaid Other | Admitting: Advanced Practice Midwife

## 2022-04-30 VITALS — BP 110/72 | HR 96 | Temp 97.6°F | Wt 193.8 lb

## 2022-04-30 DIAGNOSIS — Z3480 Encounter for supervision of other normal pregnancy, unspecified trimester: Secondary | ICD-10-CM

## 2022-04-30 DIAGNOSIS — O99013 Anemia complicating pregnancy, third trimester: Secondary | ICD-10-CM

## 2022-04-30 NOTE — Progress Notes (Signed)
Aware of date and time of IOL. BTHIELE RN

## 2022-04-30 NOTE — Telephone Encounter (Signed)
Per Epic, able to see D. Redmond Pulling CNM midwife documentation that IOL scheduled for 05/05/2022. Call to [facility] to verify and obtain time. Spoke with Glenard Haring who was not able to locate above information. Call to  [facility] and spoke with Stanton Kidney who reports IOL is scheduled for 05/05/2022 at 0800. Call to client with IOL information and left message to call with number provided. Rich Number, RN

## 2022-04-30 NOTE — Progress Notes (Signed)
[place] Department M[facility] Clinic  PRENATAL VISIT NOTE  Subjective:  Charlene Combs is a 21 y.o. G2P1001 at [redacted]w[redacted]d being seen today for ongoing prenatal care.  She is currently monitored for the following issues for this low-risk pregnancy and has Elevated troponin; Family history of autism; History of seizures as a child; Supervision of other normal pregnancy, antepartum; Marijuana use; and Anemia affecting pregnancy on their problem list.  Patient reports no complaints.  Contractions: Not present. Vag. Bleeding: None.  Movement: Present. Denies leaking of fluid/ROM.   The following portions of the patient's history were reviewed and updated as appropriate: allergies, current medications, past family history, past medical history, past social history, past surgical history and problem list. Problem list updated.  Objective:   Vitals:   04/30/22 0955  BP: 110/72  Pulse: 96  Temp: 97.6 F (36.4 C)  Weight: 193 lb 12.8 oz (87.9 kg)    Fetal Status: Fetal Heart Rate (bpm): 144 Fundal Height: 40 cm Movement: Present  Presentation: Vertex  General:  Alert, oriented and cooperative. Patient is in no acute distress.  Skin: Skin is warm and dry. No rash noted.   Cardiovascular: Normal heart rate noted  Respiratory: Normal respiratory effort, no problems with respiration noted  Abdomen: Soft, gravid, appropriate for gestational age.  Pain/Pressure: Absent     Pelvic: Cervical exam performed Dilation: Closed Effacement (%): 50 Station: -3  Extremities: Normal range of motion.  Edema: None  Mental Status: Normal mood and affect. Normal behavior. Normal judgment and thought content.   Assessment and Plan:  Pregnancy: G2P1001 at [redacted]w[redacted]d 1. Supervision of other normal pregnancy, antepartum 73 lb 12.8 oz (33.5 kg) Stopped working at 37 wks IOL scheduled for 05/05/22 Knows when to go to L&D Has car seat, crib Walking 3x/wk x 30 min  2. Anemia affecting pregnancy in  third trimester Taking FeSo4 I daily with oj    Term labor symptoms and general obstetric precautions including but not limited to vaginal bleeding, contractions, leaking of fluid and fetal movement were reviewed in detail with the patient. Please refer to After Visit Summary for other counseling recommendations.  No follow-ups on file.  No future appointments.  Herbie Saxon, CNM

## 2022-04-30 NOTE — Telephone Encounter (Signed)
Notified of IOL appt date / time at [facility] RV appt today. Rich Number, RN

## 2022-05-05 ENCOUNTER — Encounter: Payer: Self-pay | Admitting: Obstetrics and Gynecology

## 2022-05-05 ENCOUNTER — Inpatient Hospital Stay: Payer: Medicaid Other | Admitting: Anesthesiology

## 2022-05-05 ENCOUNTER — Inpatient Hospital Stay
Admission: RE | Admit: 2022-05-05 | Discharge: 2022-05-07 | DRG: 807 | Disposition: A | Discharge status: Home / Self Care | Payer: Medicaid Other | Attending: Obstetrics and Gynecology | Admitting: Obstetrics and Gynecology

## 2022-05-05 DIAGNOSIS — Z3A41 41 weeks gestation of pregnancy: Secondary | ICD-10-CM

## 2022-05-05 DIAGNOSIS — O480 Post-term pregnancy: Secondary | ICD-10-CM | POA: Diagnosis present

## 2022-05-05 DIAGNOSIS — O9902 Anemia complicating childbirth: Secondary | ICD-10-CM | POA: Diagnosis present

## 2022-05-05 DIAGNOSIS — Z3490 Encounter for supervision of normal pregnancy, unspecified, unspecified trimester: Secondary | ICD-10-CM

## 2022-05-05 DIAGNOSIS — Z3403 Encounter for supervision of normal first pregnancy, third trimester: Secondary | ICD-10-CM | POA: Diagnosis not present

## 2022-05-05 HISTORY — DX: Encounter for supervision of normal pregnancy, unspecified, unspecified trimester: Z34.90

## 2022-05-05 LAB — TYPE AND SCREEN
ABO/RH(D): O POS
Antibody Screen: NEGATIVE

## 2022-05-05 LAB — URINE DRUG SCREEN, QUALITATIVE (ARMC ONLY)
Amphetamines, Ur Screen: NOT DETECTED
Barbiturates, Ur Screen: NOT DETECTED
Benzodiazepine, Ur Scrn: NOT DETECTED
Cannabinoid 50 Ng, Ur ~~LOC~~: NOT DETECTED
Cocaine Metabolite,Ur ~~LOC~~: NOT DETECTED
MDMA (Ecstasy)Ur Screen: NOT DETECTED
Methadone Scn, Ur: NOT DETECTED
Opiate, Ur Screen: NOT DETECTED
Phencyclidine (PCP) Ur S: NOT DETECTED
Tricyclic, Ur Screen: NOT DETECTED

## 2022-05-05 LAB — CBC
HCT: 33.5 % — ABNORMAL LOW (ref 36.0–46.0)
Hemoglobin: 10.8 g/dL — ABNORMAL LOW (ref 12.0–15.0)
MCH: 26.3 pg (ref 26.0–34.0)
MCHC: 32.2 g/dL (ref 30.0–36.0)
MCV: 81.7 fL (ref 80.0–100.0)
Platelets: 344 10*3/uL (ref 150–400)
RBC: 4.1 MIL/uL (ref 3.87–5.11)
RDW: 14.6 % (ref 11.5–15.5)
WBC: 12.1 10*3/uL — ABNORMAL HIGH (ref 4.0–10.5)
nRBC: 0 % (ref 0.0–0.2)

## 2022-05-05 MED ORDER — ONDANSETRON HCL 4 MG/2ML IJ SOLN
4.0000 mg | Freq: Four times a day (QID) | INTRAMUSCULAR | Status: DC | PRN
  Administered 2022-05-05: 4 mg via INTRAVENOUS
  Filled 2022-05-05: qty 2

## 2022-05-05 MED ORDER — EPHEDRINE 5 MG/ML INJ: 10.0000 mg | INTRAVENOUS | Status: DC | PRN

## 2022-05-05 MED ORDER — OXYTOCIN BOLUS FROM INFUSION
333.0000 mL | Freq: Once | INTRAVENOUS | Status: AC
  Administered 2022-05-06: 333 mL via INTRAVENOUS

## 2022-05-05 MED ORDER — OXYTOCIN-SODIUM CHLORIDE 30-0.9 UT/500ML-% IV SOLN
1.0000 m[IU]/min | INTRAVENOUS | Status: DC
  Administered 2022-05-05: 6 m[IU]/min via INTRAVENOUS
  Administered 2022-05-05: 2 m[IU]/min via INTRAVENOUS
  Filled 2022-05-05: qty 500

## 2022-05-05 MED ORDER — MISOPROSTOL 200 MCG PO TABS
ORAL_TABLET | ORAL | Status: AC
  Filled 2022-05-05: qty 4

## 2022-05-05 MED ORDER — LACTATED RINGERS IV SOLN: INTRAVENOUS | Status: DC

## 2022-05-05 MED ORDER — LIDOCAINE HCL (PF) 1 % IJ SOLN: 30.0000 mL | INTRAMUSCULAR | Status: DC | PRN

## 2022-05-05 MED ORDER — LIDOCAINE-EPINEPHRINE (PF) 1.5 %-1:200000 IJ SOLN
INTRAMUSCULAR | Status: DC | PRN
  Administered 2022-05-05: 3 mL via EPIDURAL

## 2022-05-05 MED ORDER — MISOPROSTOL 25 MCG QUARTER TABLET: 25.0000 ug | ORAL_TABLET | ORAL | Status: DC

## 2022-05-05 MED ORDER — TERBUTALINE SULFATE 1 MG/ML IJ SOLN: 0.2500 mg | Freq: Once | INTRAMUSCULAR | Status: DC | PRN

## 2022-05-05 MED ORDER — PHENYLEPHRINE 80 MCG/ML (10ML) SYRINGE FOR IV PUSH (FOR BLOOD PRESSURE SUPPORT): 80.0000 ug | PREFILLED_SYRINGE | INTRAVENOUS | Status: DC | PRN

## 2022-05-05 MED ORDER — LIDOCAINE HCL (PF) 1 % IJ SOLN
INTRAMUSCULAR | Status: AC
  Filled 2022-05-05: qty 30

## 2022-05-05 MED ORDER — LIDOCAINE HCL (PF) 1 % IJ SOLN
INTRAMUSCULAR | Status: DC | PRN
  Administered 2022-05-05: 2 mL

## 2022-05-05 MED ORDER — LACTATED RINGERS IV SOLN
500.0000 mL | INTRAVENOUS | Status: DC | PRN
  Administered 2022-05-05: 500 mL via INTRAVENOUS

## 2022-05-05 MED ORDER — LACTATED RINGERS IV SOLN
500.0000 mL | Freq: Once | INTRAVENOUS | Status: AC
  Administered 2022-05-05: 500 mL via INTRAVENOUS

## 2022-05-05 MED ORDER — OXYTOCIN 10 UNIT/ML IJ SOLN
INTRAMUSCULAR | Status: AC
  Filled 2022-05-05: qty 2

## 2022-05-05 MED ORDER — SOD CITRATE-CITRIC ACID 500-334 MG/5ML PO SOLN: 30.0000 mL | ORAL | Status: DC | PRN

## 2022-05-05 MED ORDER — ACETAMINOPHEN 325 MG PO TABS
650.0000 mg | ORAL_TABLET | ORAL | Status: DC | PRN
  Administered 2022-05-05: 650 mg via ORAL
  Filled 2022-05-05: qty 2

## 2022-05-05 MED ORDER — SODIUM CHLORIDE 0.9 % IV SOLN
INTRAVENOUS | Status: DC | PRN
  Administered 2022-05-05 (×2): 5 mL via EPIDURAL

## 2022-05-05 MED ORDER — DIPHENHYDRAMINE HCL 50 MG/ML IJ SOLN: 12.5000 mg | INTRAMUSCULAR | Status: DC | PRN

## 2022-05-05 MED ORDER — FENTANYL-BUPIVACAINE-NACL 0.5-0.125-0.9 MG/250ML-% EP SOLN
12.0000 mL/h | EPIDURAL | Status: DC | PRN
  Administered 2022-05-05: 12 mL/h via EPIDURAL
  Filled 2022-05-05: qty 250

## 2022-05-05 MED ORDER — OXYTOCIN-SODIUM CHLORIDE 30-0.9 UT/500ML-% IV SOLN
2.5000 [IU]/h | INTRAVENOUS | Status: DC
  Administered 2022-05-06: 2.5 [IU]/h via INTRAVENOUS

## 2022-05-05 MED ORDER — FENTANYL CITRATE (PF) 100 MCG/2ML IJ SOLN: 50.0000 ug | INTRAMUSCULAR | Status: DC | PRN

## 2022-05-05 MED ORDER — AMMONIA AROMATIC IN INHA
RESPIRATORY_TRACT | Status: AC
  Filled 2022-05-05: qty 10

## 2022-05-05 NOTE — Plan of Care (Signed)

## 2022-05-05 NOTE — H&P (Signed)
OB History & Physical   History of Present Illness:  Chief Complaint:   HPI:  Charlene Combs is a 21 y.o. [place] female at [redacted]w[redacted]d dated by LMP c/w [redacted]w[redacted]d [place].  She presents to L&D for scheduled IOL for post-dates.  Active FM Occasional contractions No leaking fluid No vaginal bleeding    Pregnancy Issues: History of seizures as a child Family history of autism Marijuana use Anemia Elevated troponin    Maternal Medical History:   Past Medical History:  Diagnosis Date   Anemia affecting pregnancy 02/08/2022   febrile seizures    At age 73   Headache    Migraines   History of migraine headaches     Past Surgical History:  Procedure Laterality Date   NO PAST SURGERIES      No Known Allergies  Prior to Admission medications   Medication Sig Start Date End Date Taking? Authorizing Provider  ferrous sulfate (FERROUSUL) 325 (65 FE) MG tablet Take 1 tablet (325 mg total) by mouth daily with breakfast. 02/08/22  Yes Sciora, Real Cons, CNM  Prenatal Vit-Fe Fumarate-FA (PRENATAL MULTIVITAMIN) TABS tablet Take 1 tablet by mouth daily at 12 noon.   Yes [provider]     Prenatal care site: [place] History: She  reports that she has never smoked. She has never been exposed to tobacco smoke. She has never used smokeless tobacco. She reports that she does not currently use alcohol. She reports that she does not currently use drugs after having used the following drugs: Marijuana.  Family History: family history includes Anxiety disorder in her sister; Bipolar disorder in her maternal grandmother and mother; Depression in her father, mother, and sister; Gestational diabetes in her mother; Hypertension in her father; Seizures in her maternal grandmother and mother; Sickle cell trait in her brother and sister; Stomach cancer in her maternal grandmother.   Review of Systems: A full review of systems was performed and negative except as noted in the  HPI.     Physical Exam:  Vital Signs: BP 127/76 (BP Location: Left Arm)   Pulse (!) 115   Temp 98 F (36.7 C) (Oral)   Resp 16   Wt 87.9 kg   LMP 07/21/2021 (Within Days)   BMI 33.27 kg/m  General: no acute distress.  HEENT: normocephalic, atraumatic Heart: regular rate & rhythm.  No murmurs/rubs/gallops Lungs: clear to auscultation bilaterally, normal respiratory effort Abdomen: soft, gravid, non-tender;  EFW: 7lb Pelvic:   External: Normal external female genitalia  Cervix: Dilation: 2 / Effacement (%): Thick /      Extremities: non-tender, symmetric, no edema bilaterally.  DTRs: +2  Neurologic: Alert & oriented x 3.    Results for orders placed or performed during the hospital encounter of 05/05/22 (from the past 24 hour(s))  CBC     Status: Abnormal   Collection Time: 05/05/22  9:06 AM  Result Value Ref Range   WBC 12.1 (H) 4.0 - 10.5 K/uL   RBC 4.10 3.87 - 5.11 MIL/uL   Hemoglobin 10.8 (L) 12.0 - 15.0 g/dL   HCT 33.5 (L) 36.0 - 46.0 %   MCV 81.7 80.0 - 100.0 fL   MCH 26.3 26.0 - 34.0 pg   MCHC 32.2 30.0 - 36.0 g/dL   RDW 14.6 11.5 - 15.5 %   Platelets 344 150 - 400 K/uL   nRBC 0.0 0.0 - 0.2 %    Pertinent Results:  Prenatal Labs: Blood type/Rh O pos  Antibody screen neg  Rubella MMR x 2 (07/20/00, 03/10/04)  Varicella Varivax x 2 (07/20/00, 08/22/08)  RPR NR  HBsAg Neg  HIV NR  GC neg  Chlamydia neg  Genetic screening Quad screen 11/09/21 negative  1 hour GTT 118  3 hour GTT N/a  GBS neg   FHT: 160bpm, moderate variability, no accelerations, occasional variable decelerations,  TOCO: contractions occasional SVE:  Dilation: 2 / Effacement (%): Thick /   -2   Cephalic by leopolds  No results found.  Assessment:  Charlene Combs is a 21 y.o. G2P1001 female at 56w1dwith IOL for post-dates.   Plan:  1. Admit to Labor & Delivery; consents reviewed and obtained  2. Fetal Well being  - Fetal Tracing: Category II tracing, had one prolonged deceleration  that resolved with position changes, occasional variable decelerations. Dr. JGlennon Macaware of fetal heart rate deceleration. - Group B Streptococcus ppx indicated: n/a, GBS negative - Presentation: vertex confirmed by SVE   3. Routine OB: - Prenatal labs reviewed, as above - Rh pos - CBC, T&S, RPR on admit - UDS for history of marijuana use - Clear fluids, IVF  4. Induction of Labor -  Contractions occasional, external toco in place -  Pelvis proven to 3360g -  Plan for induction with pitocin. We discussed cytotec for cervical ripening, but d/t occasional fetal heart rate decelerations, decided against cytotec. Will induce with pitocin and monitor fetal heart rate tracing carefully. -  Plan for continuous fetal monitoring  -  Maternal pain control as desired; requesting unmedicated, but open to epidural - Anticipate vaginal delivery  5. Post Partum Planning: - Infant feeding: breastfeeding - Contraception: condoms  DGertie Fey CNorth Dakota10/04/23 9:59 AM

## 2022-05-05 NOTE — Anesthesia Preprocedure Evaluation (Signed)
Anesthesia Evaluation  Patient identified by MRN, date of birth, ID band Patient awake    Reviewed: Allergy & Precautions, NPO status , Patient's Chart, lab work & pertinent test results  Airway Mallampati: III  TM Distance: >3 FB Neck ROM: full    Dental  (+) Chipped   Pulmonary neg pulmonary ROS,    Pulmonary exam normal        Cardiovascular Exercise Tolerance: Good negative cardio ROS Normal cardiovascular exam     Neuro/Psych  Headaches,    GI/Hepatic negative GI ROS,   Endo/Other    Renal/GU   negative genitourinary   Musculoskeletal   Abdominal   Peds  Hematology  (+) Blood dyscrasia, anemia ,   Anesthesia Other Findings Past Medical History: 02/08/2022: Anemia affecting pregnancy No date: febrile seizures     Comment:  At age 65 No date: Headache     Comment:  Migraines No date: History of migraine headaches  Past Surgical History: No date: NO PAST SURGERIES  BMI    Body Mass Index: 33.27 kg/m      Reproductive/Obstetrics (+) Pregnancy                             Anesthesia Physical Anesthesia Plan  ASA: 2  Anesthesia Plan: Epidural   Post-op Pain Management:    Induction:   PONV Risk Score and Plan:   Airway Management Planned:   Additional Equipment:   Intra-op Plan:   Post-operative Plan:   Informed Consent: I have reviewed the patients History and Physical, chart, labs and discussed the procedure including the risks, benefits and alternatives for the proposed anesthesia with the patient or authorized representative who has indicated his/her understanding and acceptance.       Plan Discussed with: Anesthesiologist  Anesthesia Plan Comments:         Anesthesia Quick Evaluation

## 2022-05-05 NOTE — Progress Notes (Signed)
Labor Progress Note  Charlene Combs is a 21 y.o. G2P1001 at [redacted]w[redacted]d by LMP admitted for induction of labor due to Post dates. Due date 04/27/2022.  Subjective: She reports rectal pressure with contractions  Objective: BP 122/79   Pulse 82   Temp (!) 97.3 F (36.3 C) (Oral)   Resp 16   Ht 5\' 4"  (1.626 m)   Wt 87.9 kg   LMP 07/21/2021 (Within Days)   SpO2 98%   BMI 33.27 kg/m  Notable VS details: reviewed  Fetal Assessment: FHT:  FHR: 135 bpm, variability: moderate,  accelerations:  Present,  decelerations:  Present prolonged deceleration from position change, resolved with maternal position change Category/reactivity:  Category II UC:   regular, every 3-4 minutes SVE:    Dilation: 4cm  Effacement: 60%  Station:  -2  Consistency: soft  Position: posterior  Membrane status:SROM @ 1345 Amniotic color: clear  Labs: Lab Results  Component Value Date   WBC 12.1 (H) 05/05/2022   HGB 10.8 (L) 05/05/2022   HCT 33.5 (L) 05/05/2022   MCV 81.7 05/05/2022   PLT 344 05/05/2022   Assessment / Plan: Induction of labor due to post-dates pregnancy,  progressing well on pitocin  Labor:  Progressing normally on pitocin, SROM with clear fluid. Pitocin turned off briefly for fetal heart rate deceleration, now currently on  4mu/min Preeclampsia:  labs stable Fetal Wellbeing:  Category II, prolonged deceleration resolved with position changes, pitocin turned off during deceleration and restarted about 10 minutes later Pain Control:  Labor support without medications I/D:   GBS negative Anticipated MOD:  NSVD  Gertie Fey, CNM 05/05/2022, 3:06 PM

## 2022-05-05 NOTE — Anesthesia Procedure Notes (Signed)
Epidural Patient location during procedure: OB Start time: 05/05/2022 6:04 PM End time: 05/05/2022 6:25 PM  Staffing Anesthesiologist: Iran Ouch, MD Performed: anesthesiologist   Preanesthetic Checklist Completed: patient identified, IV checked, site marked, risks and benefits discussed, surgical consent, monitors and equipment checked, pre-op evaluation and timeout performed  Epidural Patient position: sitting Prep: ChloraPrep Patient monitoring: heart rate, continuous pulse ox and blood pressure Approach: midline Location: L3-L4 Injection technique: LOR saline  Needle:  Needle type: Tuohy  Needle gauge: 18 G Needle length: 9 cm Needle insertion depth: 7 cm Catheter type: closed end Catheter size: 20 Guage Catheter at skin depth: 12 cm Test dose: negative and 1.5% lidocaine with Epi 1:200 K  Assessment Events: blood not aspirated, injection not painful, no injection resistance and no paresthesia  Additional Notes Reason for block:procedure for pain

## 2022-05-06 DIAGNOSIS — Z3403 Encounter for supervision of normal first pregnancy, third trimester: Secondary | ICD-10-CM | POA: Diagnosis not present

## 2022-05-06 LAB — CBC
HCT: 34.5 % — ABNORMAL LOW (ref 36.0–46.0)
Hemoglobin: 10.9 g/dL — ABNORMAL LOW (ref 12.0–15.0)
MCH: 26 pg (ref 26.0–34.0)
MCHC: 31.6 g/dL (ref 30.0–36.0)
MCV: 82.3 fL (ref 80.0–100.0)
Platelets: 339 10*3/uL (ref 150–400)
RBC: 4.19 MIL/uL (ref 3.87–5.11)
RDW: 14.7 % (ref 11.5–15.5)
WBC: 20.3 10*3/uL — ABNORMAL HIGH (ref 4.0–10.5)
nRBC: 0 % (ref 0.0–0.2)

## 2022-05-06 LAB — RPR: RPR Ser Ql: NONREACTIVE

## 2022-05-06 MED ORDER — COCONUT OIL OIL: 1.0000 | TOPICAL_OIL | Status: DC | PRN

## 2022-05-06 MED ORDER — ZOLPIDEM TARTRATE 5 MG PO TABS: 5.0000 mg | ORAL_TABLET | Freq: Every evening | ORAL | Status: DC | PRN

## 2022-05-06 MED ORDER — SENNOSIDES-DOCUSATE SODIUM 8.6-50 MG PO TABS
2.0000 | ORAL_TABLET | Freq: Every day | ORAL | Status: DC
  Administered 2022-05-06 – 2022-05-07 (×2): 2 via ORAL
  Filled 2022-05-06 (×2): qty 2

## 2022-05-06 MED ORDER — ACETAMINOPHEN 325 MG PO TABS
650.0000 mg | ORAL_TABLET | ORAL | Status: DC | PRN
  Administered 2022-05-07: 650 mg via ORAL
  Filled 2022-05-06: qty 2

## 2022-05-06 MED ORDER — BENZOCAINE-MENTHOL 20-0.5 % EX AERO
1.0000 | INHALATION_SPRAY | CUTANEOUS | Status: DC | PRN
  Administered 2022-05-06: 1 via TOPICAL
  Filled 2022-05-06: qty 56

## 2022-05-06 MED ORDER — ONDANSETRON HCL 4 MG/2ML IJ SOLN: 4.0000 mg | INTRAMUSCULAR | Status: DC | PRN

## 2022-05-06 MED ORDER — TETANUS-DIPHTH-ACELL PERTUSSIS 5-2.5-18.5 LF-MCG/0.5 IM SUSY: 0.5000 mL | PREFILLED_SYRINGE | Freq: Once | INTRAMUSCULAR | Status: DC

## 2022-05-06 MED ORDER — PRENATAL MULTIVITAMIN CH
1.0000 | ORAL_TABLET | Freq: Every day | ORAL | Status: DC
  Administered 2022-05-06 – 2022-05-07 (×2): 1 via ORAL
  Filled 2022-05-06 (×2): qty 1

## 2022-05-06 MED ORDER — DIBUCAINE (PERIANAL) 1 % EX OINT
1.0000 | TOPICAL_OINTMENT | CUTANEOUS | Status: DC | PRN
  Administered 2022-05-06: 1 via RECTAL
  Filled 2022-05-06: qty 28

## 2022-05-06 MED ORDER — ONDANSETRON HCL 4 MG PO TABS: 4.0000 mg | ORAL_TABLET | ORAL | Status: DC | PRN

## 2022-05-06 MED ORDER — DIPHENHYDRAMINE HCL 25 MG PO CAPS: 25.0000 mg | ORAL_CAPSULE | Freq: Four times a day (QID) | ORAL | Status: DC | PRN

## 2022-05-06 MED ORDER — IBUPROFEN 600 MG PO TABS
600.0000 mg | ORAL_TABLET | Freq: Four times a day (QID) | ORAL | Status: DC
  Administered 2022-05-06 – 2022-05-07 (×6): 600 mg via ORAL
  Filled 2022-05-06 (×6): qty 1

## 2022-05-06 MED ORDER — SENNOSIDES-DOCUSATE SODIUM 8.6-50 MG PO TABS: 2.0000 | ORAL_TABLET | Freq: Every day | ORAL | Status: DC

## 2022-05-06 MED ORDER — SIMETHICONE 80 MG PO CHEW: 80.0000 mg | CHEWABLE_TABLET | ORAL | Status: DC | PRN

## 2022-05-06 MED ORDER — WITCH HAZEL-GLYCERIN EX PADS
1.0000 | MEDICATED_PAD | CUTANEOUS | Status: DC | PRN
  Administered 2022-05-06 – 2022-05-07 (×2): 1 via TOPICAL
  Filled 2022-05-06 (×2): qty 100

## 2022-05-06 NOTE — Progress Notes (Signed)
Postpartum Day  0  Subjective: no complaints, up ad lib, voiding, and tolerating PO  Doing well, no concerns. Ambulating without difficulty, pain managed with PO meds, tolerating regular diet, and voiding without difficulty.   No fever/chills, chest pain, shortness of breath, nausea/vomiting, or leg pain. No nipple or breast pain. No headache, visual changes, or RUQ/epigastric pain.  Objective: BP 113/84 (BP Location: Right Arm)   Pulse 82   Temp 97.8 F (36.6 C) (Oral)   Resp 18   Ht 5\' 4"  (1.626 m)   Wt 87.9 kg   LMP 07/21/2021 (Within Days)   SpO2 100%   BMI 33.27 kg/m    Physical Exam:  General: alert, cooperative, and no distress Breasts: soft/nontender CV: RRR Pulm: nl effort, CTABL Abdomen: soft, non-tender, active bowel sounds Uterine Fundus: firm Perineum:  minimal edema, intact Lochia: appropriate DVT Evaluation: No evidence of DVT seen on physical exam.  Recent Labs    05/05/22 0906 05/06/22 0503  HGB 10.8* 10.9*  HCT 33.5* 34.5*  WBC 12.1* 20.3*  PLT 344 339    Assessment/Plan: 21 y.o. G2P1001 postpartum day # 0  -Continue routine postpartum care -Lactation consult PRN for breastfeeding  -CBC reviewed - acute leukocytosis - asymptomatic and afebrile -Repeat CBC in AM   Disposition: Continue inpatient postpartum care    LOS: 1 day   Minda Meo, CNM 05/06/2022, 6:52 PM   ----- Drinda Butts  Certified Nurse Midwife [facility]

## 2022-05-06 NOTE — Lactation Note (Signed)
This note was copied from a baby's chart. Lactation Consultation Note  Patient Name: Charlene Combs WGNFA'O Date: 05/06/2022   Age:23 hours  Spoke with care RN, mom has voiced desire to just formula feed- no longer desires to breastfeed. Guidance given on how to dry up milk supply: tight supporting bra, no stimulation, cabbage leaves, ice for swelling.  Lavonia Drafts 05/06/2022, 9:28 AM

## 2022-05-06 NOTE — Discharge Summary (Signed)
Obstetrical Discharge Summary  Patient Name: Charlene Combs DOB: 2001-07-13 MRN: [id number]  Date of Admission: 05/05/2022 Date of Delivery: 05/06/22 Delivered by: Lucrezia Europe, CNM Date of Discharge: 05/07/2022  Primary OB: [facility]  [id number] last menstrual period was 07/21/2021 (within days). EDC Estimated Date of Delivery: 04/27/22 Gestational Age at Delivery: [redacted]w[redacted]d  Antepartum complications:  History of seizures as a child Family history of autism Marijuana use Anemia Elevated troponin Admitting Diagnosis: IOL for post-dates Secondary Diagnosis: NSVD Patient Active Problem List   Diagnosis Date Noted   Encounter for induction of labor 05/05/2022   Anemia affecting pregnancy 02/08/2022   Supervision of other normal pregnancy, antepartum 10/12/2021   Marijuana use 10/12/2021   Family history of autism    History of seizures as a child    Elevated troponin 12/16/2014    Augmentation: Pitocin Complications: None Intrapartum complications/course: She arrived for IOL and had a prolonged deceleration. Decision made to induce with pitocin. She SROM'd with clear fluid and progressed to 10/100/+2. She pushed for 66m and delivered viable female infant over intact perineum.  Date of Delivery: 05/06/22 Delivered By: DaLucrezia EuropeCNM Delivery Type: spontaneous vaginal delivery Anesthesia: epidural Placenta: spontaneous Laceration: none Episiotomy: none Newborn Data: Live born female  Birth Weight:  8#3 APGAR: 8, 9  Newborn Delivery   Birth date/time: 05/06/2022 00:26:39 Delivery type: Vaginal, Spontaneous      Postpartum Procedures: none  Edinburgh:     05/06/2022    1:26 PM 08/19/2018   12:51 PM  Edinburgh Postnatal Depression Scale Screening Tool  I have been able to laugh and see the funny side of things. 0 0  I have looked forward with enjoyment to things. 0 0  I have blamed myself unnecessarily when things went wrong. 1 0  I have been anxious or worried  for no good reason. 0 0  I have felt scared or panicky for no good reason. 0 0  Things have been getting on top of me. 0 1  I have been so unhappy that I have had difficulty sleeping. 0 0  I have felt sad or miserable. 0 0  I have been so unhappy that I have been crying. 0 0  The thought of harming myself has occurred to me. 0 0  Edinburgh Postnatal Depression Scale Total 1 1      Post partum course:  Patient had an uncomplicated postpartum course.  By time of discharge on PPD#1, her pain was controlled on oral pain medications; she had appropriate lochia and was ambulating, voiding without difficulty and tolerating regular diet.  She was deemed stable for discharge to home.    Discharge Physical Exam: 05/07/2022 at 1:11 PM  BP 117/87 (BP Location: Right Arm)   Pulse 80   Temp 98.1 F (36.7 C) (Oral)   Resp 20   Ht _0  (1.626 m)   Wt 87.9 kg   LMP 07/21/2021 (Within Days)   SpO2 100%   BMI 33.27 kg/m   General: NAD CV: RRR Pulm: CTABL, nl effort ABD: s/nd/nt, fundus firm and below the umbilicus Lochia: moderate Perineum: well approximated/intact DVT Evaluation: LE non-ttp, no evidence of DVT on exam.  Hemoglobin  Date Value Ref Range Status  05/07/2022 9.5 (L) 12.0 - 15.0 g/dL Final  02/08/2022 10.4 (L) 11.1 - 15.9 g/dL Final   HCT  Date Value Ref Range Status  05/07/2022 29.9 (L) 36.0 - 46.0 % Final   Hematocrit  Date Value Ref Range Status  02/08/2022 31.5 (L) 34.0 - 46.6 % Final     Disposition: stable, discharge to home. Baby Feeding: breastmilk Baby Disposition: home with mom  Rh Immune globulin given: n/a, O pos Rubella vaccine given: received as a child Varicella vaccine given: received as a child Tdap vaccine given in AP or PP setting: 02/08/22 Flu vaccine given in AP or PP setting: offer in season  Contraception: condoms  Prenatal Labs:  Blood type/Rh O pos  Antibody screen neg  Rubella MMR x 2 (07/20/00, 03/10/04)  Varicella Varivax x 2  (07/20/00, 08/22/08)  RPR NR  HBsAg Neg  HIV NR  GC neg  Chlamydia neg  Genetic screening Quad screen 11/09/21 negative  1 hour GTT 118  3 hour GTT N/a  GBS neg     Plan:  Hoyt Koch was discharged to home in good condition. Follow-up appointment with delivering provider in 6 weeks.  Discharge Medications: Allergies as of 05/07/2022   No Known Allergies      Medication List     TAKE these medications    acetaminophen 325 MG tablet Commonly known as: Tylenol Take 2 tablets (650 mg total) by mouth every 4 (four) hours as needed (for pain scale < 4).   benzocaine-Menthol 20-0.5 % Aero Commonly known as: DERMOPLAST Apply 1 Application topically as needed for irritation (perineal discomfort).   coconut oil Oil Apply 1 Application topically as needed.   diphenhydrAMINE 25 mg capsule Commonly known as: BENADRYL Take 1 capsule (25 mg total) by mouth every 6 (six) hours as needed for itching.   ferrous sulfate 325 (65 FE) MG tablet Commonly known as: FerrouSul Take 1 tablet (325 mg total) by mouth daily with breakfast.   ibuprofen 600 MG tablet Commonly known as: ADVIL Take 1 tablet (600 mg total) by mouth every 6 (six) hours.   prenatal multivitamin Tabs tablet Take 1 tablet by mouth daily at 12 noon.   senna-docusate 8.6-50 MG tablet Commonly known as: Senokot-S Take 2 tablets by mouth daily. Start taking on: May 08, 2022   simethicone 80 MG chewable tablet Commonly known as: MYLICON Chew 1 tablet (80 mg total) by mouth as needed for flatulence.   witch hazel-glycerin pad Commonly known as: TUCKS Apply 1 Application topically as needed for hemorrhoids.         Follow-up Information     Gertie Fey, CNM. Schedule an appointment as soon as possible for a visit.   Specialty: Certified Nurse Midwife Why: 6 wk PP visit Contact information: [place] [address] [phone]                  Signed:  Francetta Found, CNM 05/07/2022 1:10 PM

## 2022-05-06 NOTE — Progress Notes (Signed)
Labor Progress Note  Charlene Combs is a 21 y.o. G2P1001 at [redacted]w[redacted]d by LMP admitted for induction of labor due to post-dates.  Subjective: she is comfortable after her epidural  Objective: BP 97/62   Pulse 87   Temp 98.2 F (36.8 C) (Oral)   Resp 18   Ht 5\' 4"  (1.626 m)   Wt 87.9 kg   LMP 07/21/2021 (Within Days)   SpO2 100%   BMI 33.27 kg/m  Notable VS details: reviewed  Fetal Assessment: FHT:  FHR: 125 bpm, variability: moderate,  accelerations:  Present,  decelerations:  Absent Category/reactivity:  Category I UC:   regular, every 2-4 minutes SVE:    Dilation: 8cm  Effacement: 80%  Station:  -1  Consistency: soft  Position: anterior  Membrane status:SROM @ 1345 Amniotic color: clear  Labs: Lab Results  Component Value Date   WBC 12.1 (H) 05/05/2022   HGB 10.8 (L) 05/05/2022   HCT 33.5 (L) 05/05/2022   MCV 81.7 05/05/2022   PLT 344 05/05/2022    Assessment / Plan: Induction of labor due to post-dates pregnancy,  progressing well on pitocin  Labor:  Progressing normally on pitocin, SROM with clear fluid. Pitocin currently on  54mu/min Preeclampsia:  labs stable Fetal Wellbeing:  Category I Pain Control:  Epidural I/D:   GBS negative Anticipated MOD:  NSVD  Gertie Fey, CNM 05/06/2022, 12:58 AM

## 2022-05-07 LAB — CBC
HCT: 29.9 % — ABNORMAL LOW (ref 36.0–46.0)
Hemoglobin: 9.5 g/dL — ABNORMAL LOW (ref 12.0–15.0)
MCH: 26.4 pg (ref 26.0–34.0)
MCHC: 31.8 g/dL (ref 30.0–36.0)
MCV: 83.1 fL (ref 80.0–100.0)
Platelets: 305 10*3/uL (ref 150–400)
RBC: 3.6 MIL/uL — ABNORMAL LOW (ref 3.87–5.11)
RDW: 14.7 % (ref 11.5–15.5)
WBC: 11.5 10*3/uL — ABNORMAL HIGH (ref 4.0–10.5)
nRBC: 0 % (ref 0.0–0.2)

## 2022-05-07 MED ORDER — WITCH HAZEL-GLYCERIN EX PADS: 1.0000 | MEDICATED_PAD | CUTANEOUS | 12 refills | Status: DC | PRN

## 2022-05-07 MED ORDER — COCONUT OIL OIL: 1.0000 | TOPICAL_OIL | 0 refills | Status: DC | PRN

## 2022-05-07 MED ORDER — DIPHENHYDRAMINE HCL 25 MG PO CAPS: 25.0000 mg | ORAL_CAPSULE | Freq: Four times a day (QID) | ORAL | 0 refills | Status: DC | PRN

## 2022-05-07 MED ORDER — BENZOCAINE-MENTHOL 20-0.5 % EX AERO: 1.0000 | INHALATION_SPRAY | CUTANEOUS | Status: DC | PRN

## 2022-05-07 MED ORDER — SIMETHICONE 80 MG PO CHEW: 80.0000 mg | CHEWABLE_TABLET | ORAL | 0 refills | Status: DC | PRN

## 2022-05-07 MED ORDER — SENNOSIDES-DOCUSATE SODIUM 8.6-50 MG PO TABS: 2.0000 | ORAL_TABLET | Freq: Every day | ORAL | Status: DC

## 2022-05-07 MED ORDER — ACETAMINOPHEN 325 MG PO TABS: 650.0000 mg | ORAL_TABLET | ORAL | Status: DC | PRN

## 2022-05-07 MED ORDER — IBUPROFEN 600 MG PO TABS: 600.0000 mg | ORAL_TABLET | Freq: Four times a day (QID) | ORAL | 0 refills | Status: DC

## 2022-05-07 NOTE — Progress Notes (Signed)
Discharge instructions, appointments, prescriptions, and education given and explained. Pt verbalized understanding with no further questions. Pt wheeled to personal vehicle via staff for d/c home.

## 2022-05-07 NOTE — Progress Notes (Signed)
Pt educated on varicella and rubella vaccination. Pt educated on vaccine availability before d/c. Pt declines both at this time.

## 2022-05-07 NOTE — Anesthesia Postprocedure Evaluation (Signed)
Anesthesia Post Note  Patient: Charlene Combs  Procedure(s) Performed: AN AD HOC LABOR EPIDURAL  Patient location during evaluation: Mother Baby Anesthesia Type: Epidural Level of consciousness: awake Pain management: pain level controlled Respiratory status: spontaneous breathing Postop Assessment: no headache Anesthetic complications: no   No notable events documented.   Last Vitals:  Vitals:   05/06/22 2309 05/07/22 0759  BP: 122/78 117/87  Pulse: 68 80  Resp: 18 20  Temp: 36.4 C 36.7 C  SpO2: 99% 100%    Last Pain:  Vitals:   05/07/22 0759  TempSrc: Oral  PainSc:                  Lerry Liner

## 2022-06-04 ENCOUNTER — Telehealth: Payer: Self-pay | Admitting: Family Medicine

## 2022-06-08 NOTE — Telephone Encounter (Signed)
[facility] for post-partum exam and has not yet responded to first call by Judieth Keens, [facility] intake clerk. Call to client today and left message requesting she reschedule her missed post-partum appt. Number to call provided. Rich Number, RN

## 2022-06-10 NOTE — Telephone Encounter (Signed)
Call to client and post-partum appt scheduled for 07/01/2022. Per client, does not plan to use any birth control other than condoms. Jossie Ng, RN

## 2022-07-01 ENCOUNTER — Encounter: Payer: Self-pay | Admitting: Physician Assistant

## 2022-07-01 ENCOUNTER — Ambulatory Visit: Payer: Medicaid Other | Admitting: Physician Assistant

## 2022-07-01 VITALS — BP 115/80 | HR 84 | Temp 97.8°F | Ht 64.0 in | Wt 174.6 lb

## 2022-07-01 DIAGNOSIS — B3731 Acute candidiasis of vulva and vagina: Secondary | ICD-10-CM | POA: Diagnosis not present

## 2022-07-01 DIAGNOSIS — Z3009 Encounter for other general counseling and advice on contraception: Secondary | ICD-10-CM

## 2022-07-01 LAB — HM HIV SCREENING LAB: HM HIV Screening: NEGATIVE

## 2022-07-01 MED ORDER — CLOTRIMAZOLE 1 % VA CREA: 1.0000 | TOPICAL_CREAM | Freq: Every day | VAGINAL | 0 refills | Status: AC

## 2022-07-01 NOTE — Progress Notes (Signed)
[facility] Department  Postpartum Exam  Charlene Combs is a 21 y.o. [phone] female who presents for a postpartum visit. She is 8 weeks postpartum following a normal spontaneous vaginal delivery.  I have full4y reviewed the prenatal and intrapartum course. The delivery was at 41 2/7 gestational weeks.  Anesthesia: epidural. Postpartum course has been uneventful. Baby is doing well. Baby is feeding by bottle - Similac "with the blue top" . Bleeding no bleeding. Bowel function is normal. Bladder function is normal. Patient is sexually active. Contraception method is condoms. Postpartum depression screening: negative.   The pregnancy intention screening data noted above was reviewed. Potential methods of contraception were discussed. The patient elected to proceed with Female Condom (Birth control consent signed).   Edinburgh Postnatal Depression Scale - 07/01/22 1544       Edinburgh Postnatal Depression Scale:  In the Past 7 Days   I have looked forward with enjoyment to things. 1    I have blamed myself unnecessarily when things went wrong. 0    I have been anxious or worried for no good reason. 0    I have felt scared or panicky for no good reason. 1    Things have been getting on top of me. 0    I have been so unhappy that I have had difficulty sleeping. 0    I have felt sad or miserable. 0    I have been so unhappy that I have been crying. 0    The thought of harming myself has occurred to me. 0             Health Maintenance Due  Topic Date Due   COVID-19 Vaccine (1) Never done   HPV VACCINES (1 - 2-dose series) Never done   INFLUENZA VACCINE  03/02/2022    The following portions of the patient's history were reviewed and updated as appropriate: allergies, current medications, past family history, past medical history, past social history, past surgical history, and problem list.  Review of Systems Pertinent items are noted in HPI. Pt c/o 1 wk h/o white vag  discharge with fishy odor.  Objective:  BP 115/80   Pulse 84   Temp 97.8 F (36.6 C)   Ht 5\' 4"  (1.626 m)   Wt 174 lb 9.6 oz (79.2 kg)   LMP 06/15/2022 (Approximate)   Breastfeeding No   BMI 29.97 kg/m    General:  alert, cooperative, appears stated age, no distress, and mildly obese   Breasts:  not indicated  Lungs: clear to auscultation bilaterally  Heart:  regular rate and rhythm, S1, S2 normal, no murmur, click, rub or gallop  Abdomen: soft, non-tender; bowel sounds normal; no masses,  no organomegaly   Wound No wounds  GU exam:   Normal except mod amt curdlike white vag discharge, erythematous vaginal mucosa. Vag pH 3.       Assessment:  1. Family planning services Pt declines more statistically reliable BCM than condoms. Annual well-woman exam due 12 mo.  2. Postpartum exam Normal pp exam except yeast vaginitis. - Hemoglobin, fingerstick - WET PREP FOR TRICH, YEAST, CLUE - HIV [facility] - Syphilis Serology, [facility]  3. Yeast vaginitis Treat per S.O. - clotrimazole (CLOTRIMAZOLE-7) 1 % vaginal cream; Place 1 Applicatorful vaginally at bedtime for 7 days.  Dispense: 45 g; Refill: 0   Plan:   Essential components of care per ACOG recommendations:  1.  Mood and well being: Patient with negative  depression screening today. Reviewed local resources for support.  - Patient tobacco use? No.   - hx of drug use? Yes, MJ.   2. Infant care and feeding:  -Patient currently breastmilk feeding? No.  -Social determinants of health (SDOH) reviewed in EPIC.  3. Sexuality, contraception and birth spacing - Patient does not want a pregnancy in the next year.  Desired family size is unsure number of children.  - Reviewed reproductive life planning. Reviewed options based on patient desire and reproductive life plan. Patient is interested in Female Condom. This was provided to the patient today.   Risks, benefits, and typical effectiveness rates were reviewed.  Questions  were answered.  Written information was also given to the patient to review.    The patient will follow up in  12 months for surveillance.  The patient was told to call with any further questions, or with any concerns about this method of contraception.  Emphasized use of condoms 100% of the time for STI prevention.  Patient was not offered ECP based on > 120 hours .  - Discussed birth spacing of 18 months  4. Sleep and fatigue -Encouraged family/partner/community support of 4 hrs of uninterrupted sleep to help with mood and fatigue  5. Physical Recovery  - Discussed patients delivery and complications. She describes her labor as good. - Patient had a Vaginal, no problems at delivery. - Patient has urinary incontinence? No. - Patient is safe to resume physical and sexual activity  6.  Health Maintenance - HM due items addressed Yes - Last pap smear No results found for: "DIAGPAP" Pap smear not done at today's visit. Next Pap due 01/2024. -Breast Cancer screening indicated? No.   7. Chronic Disease/Pregnancy Condition follow up: None  - PCP follow up - recommend est PCP and dentist for routine/acute care.  Landry Dyke, PA-C [facility] [employer], [facility]

## 2022-07-02 LAB — HEMOGLOBIN, FINGERSTICK: Hemoglobin: 12.7 g/dL (ref 11.1–15.9)

## 2022-07-02 LAB — WET PREP FOR TRICH, YEAST, CLUE: Trichomonas Exam: NEGATIVE

## 2023-03-08 DIAGNOSIS — B3731 Acute candidiasis of vulva and vagina: Secondary | ICD-10-CM | POA: Diagnosis not present

## 2023-03-08 DIAGNOSIS — Z113 Encounter for screening for infections with a predominantly sexual mode of transmission: Secondary | ICD-10-CM | POA: Diagnosis not present

## 2023-03-08 DIAGNOSIS — N760 Acute vaginitis: Secondary | ICD-10-CM | POA: Diagnosis not present

## 2023-03-25 ENCOUNTER — Ambulatory Visit (INDEPENDENT_AMBULATORY_CARE_PROVIDER_SITE_OTHER): Payer: 59

## 2023-03-25 VITALS — Wt 160.0 lb

## 2023-03-25 DIAGNOSIS — Z3689 Encounter for other specified antenatal screening: Secondary | ICD-10-CM

## 2023-03-25 DIAGNOSIS — Z369 Encounter for antenatal screening, unspecified: Secondary | ICD-10-CM

## 2023-03-25 DIAGNOSIS — Z3480 Encounter for supervision of other normal pregnancy, unspecified trimester: Secondary | ICD-10-CM | POA: Insufficient documentation

## 2023-03-25 MED ORDER — PRENATAL MULTIVITAMIN CH: 1.0000 | ORAL_TABLET | Freq: Every day | ORAL | 3 refills | Status: DC

## 2023-03-25 MED ORDER — CONCEPT DHA 53.5-38-1 MG PO CAPS: 1.0000 | ORAL_CAPSULE | Freq: Every day | ORAL | 3 refills | Status: DC

## 2023-03-25 NOTE — Patient Instructions (Signed)
First Trimester of Pregnancy  The first trimester of pregnancy starts on the first day of your last menstrual period until the end of week 12. This is also called months 1 through 3 of pregnancy. Body changes during your first trimester Your body goes through many changes during pregnancy. The changes usually return to normal after your baby is born. Physical changes You may gain or lose weight. Your breasts may grow larger and hurt. The area around your nipples may get darker. Dark spots or blotches may develop on your face. You may have changes in your hair. Health changes You may feel like you might vomit (nauseous), and you may vomit. You may have heartburn. You may have headaches. You may have trouble pooping (constipation). Your gums may bleed. Other changes You may get tired easily. You may pee (urinate) more often. Your menstrual periods will stop. You may not feel hungry. You may want to eat certain kinds of food. You may have changes in your emotions from day to day. You may have more dreams. Follow these instructions at home: Medicines Take over-the-counter and prescription medicines only as told by your doctor. Some medicines are not safe during pregnancy. Take a prenatal vitamin that contains at least 600 micrograms (mcg) of folic acid. Eating and drinking Eat healthy meals that include: Fresh fruits and vegetables. Whole grains. Good sources of protein, such as meat, eggs, or tofu. Low-fat dairy products. Avoid raw meat and unpasteurized juice, milk, and cheese. If you feel like you may vomit, or you vomit: Eat 4 or 5 small meals a day instead of 3 large meals. Try eating a few soda crackers. Drink liquids between meals instead of during meals. You may need to take these actions to prevent or treat trouble pooping: Drink enough fluids to keep your pee (urine) pale yellow. Eat foods that are high in fiber. These include beans, whole grains, and fresh fruits and  vegetables. Limit foods that are high in fat and sugar. These include fried or sweet foods. Activity Exercise only as told by your doctor. Most people can do their usual exercise routine during pregnancy. Stop exercising if you have cramps or pain in your lower belly (abdomen) or low back. Do not exercise if it is too hot or too humid, or if you are in a place of great height (high altitude). Avoid heavy lifting. If you choose to, you may have sex unless your doctor tells you not to. Relieving pain and discomfort Wear a good support bra if your breasts are sore. Rest with your legs raised (elevated) if you have leg cramps or low back pain. If you have bulging veins (varicose veins) in your legs: Wear support hose as told by your doctor. Raise your feet for 15 minutes, 3-4 times a day. Limit salt in your food. Safety Wear your seat belt at all times when you are in a car. Talk with your doctor if someone is hurting you or yelling at you. Talk with your doctor if you are feeling sad or have thoughts of hurting yourself. Lifestyle Do not use hot tubs, steam rooms, or saunas. Do not douche. Do not use tampons or scented sanitary pads. Do not use herbal medicines, illegal drugs, or medicines that are not approved by your doctor. Do not drink alcohol. Do not smoke or use any products that contain nicotine or tobacco. If you need help quitting, ask your doctor. Avoid cat litter boxes and soil that is used by cats. These carry   germs that can cause harm to the baby and can cause a loss of your baby by miscarriage or stillbirth. General instructions Keep all follow-up visits. This is important. Ask for help if you need counseling or if you need help with nutrition. Your doctor can give you advice or tell you where to go for help. Visit your dentist. At home, brush your teeth with a soft toothbrush. Floss gently. Write down your questions. Take them to your prenatal visits. Where to find more  information American Pregnancy Association: americanpregnancy.org American College of Obstetricians and Gynecologists: www.acog.org Office on Women's Health: womenshealth.gov/pregnancy Contact a doctor if: You are dizzy. You have a fever. You have mild cramps or pressure in your lower belly. You have a nagging pain in your belly area. You continue to feel like you may vomit, you vomit, or you have watery poop (diarrhea) for 24 hours or longer. You have a bad-smelling fluid coming from your vagina. You have pain when you pee. You are exposed to a disease that spreads from person to person, such as chickenpox, measles, Zika virus, HIV, or hepatitis. Get help right away if: You have spotting or bleeding from your vagina. You have very bad belly cramping or pain. You have shortness of breath or chest pain. You have any kind of injury, such as from a fall or a car crash. You have new or increased pain, swelling, or redness in an arm or leg. Summary The first trimester of pregnancy starts on the first day of your last menstrual period until the end of week 12 (months 1 through 3). Eat 4 or 5 small meals a day instead of 3 large meals. Do not smoke or use any products that contain nicotine or tobacco. If you need help quitting, ask your doctor. Keep all follow-up visits. This information is not intended to replace advice given to you by your health care provider. Make sure you discuss any questions you have with your health care provider. Document Revised: 12/26/2019 Document Reviewed: 11/01/2019 Elsevier Patient Education  2024 Elsevier Inc. Commonly Asked Questions During Pregnancy  Cats: A parasite can be excreted in cat feces.  To avoid exposure you need to have another person empty the little box.  If you must empty the litter box you will need to wear gloves.  Wash your hands after handling your cat.  This parasite can also be found in raw or undercooked meat so this should also be  avoided.  Colds, Sore Throats, Flu: Please check your medication sheet to see what you can take for symptoms.  If your symptoms are unrelieved by these medications please call the office.  Dental Work: Most any dental work your dentist recommends is permitted.  X-rays should only be taken during the first trimester if absolutely necessary.  Your abdomen should be shielded with a lead apron during all x-rays.  Please notify your provider prior to receiving any x-rays.  Novocaine is fine; gas is not recommended.  If your dentist requires a note from us prior to dental work please call the office and we will provide one for you.  Exercise: Exercise is an important part of staying healthy during your pregnancy.  You may continue most exercises you were accustomed to prior to pregnancy.  Later in your pregnancy you will most likely notice you have difficulty with activities requiring balance like riding a bicycle.  It is important that you listen to your body and avoid activities that put you at a higher   risk of falling.  Adequate rest and staying well hydrated are a must!  If you have questions about the safety of specific activities ask your provider.    Exposure to Children with illness: Try to avoid obvious exposure; report any symptoms to us when noted,  If you have chicken pos, red measles or mumps, you should be immune to these diseases.   Please do not take any vaccines while pregnant unless you have checked with your OB provider.  Fetal Movement: After 28 weeks we recommend you do "kick counts" twice daily.  Lie or sit down in a calm quiet environment and count your baby movements "kicks".  You should feel your baby at least 10 times per hour.  If you have not felt 10 kicks within the first hour get up, walk around and have something sweet to eat or drink then repeat for an additional hour.  If count remains less than 10 per hour notify your provider.  Fumigating: Follow your pest control agent's  advice as to how long to stay out of your home.  Ventilate the area well before re-entering.  Hemorrhoids:   Most over-the-counter preparations can be used during pregnancy.  Check your medication to see what is safe to use.  It is important to use a stool softener or fiber in your diet and to drink lots of liquids.  If hemorrhoids seem to be getting worse please call the office.   Hot Tubs:  Hot tubs Jacuzzis and saunas are not recommended while pregnant.  These increase your internal body temperature and should be avoided.  Intercourse:  Sexual intercourse is safe during pregnancy as long as you are comfortable, unless otherwise advised by your provider.  Spotting may occur after intercourse; report any bright red bleeding that is heavier than spotting.  Labor:  If you know that you are in labor, please go to the hospital.  If you are unsure, please call the office and let us help you decide what to do.  Lifting, straining, etc:  If your job requires heavy lifting or straining please check with your provider for any limitations.  Generally, you should not lift items heavier than that you can lift simply with your hands and arms (no back muscles)  Painting:  Paint fumes do not harm your pregnancy, but may make you ill and should be avoided if possible.  Latex or water based paints have less odor than oils.  Use adequate ventilation while painting.  Permanents & Hair Color:  Chemicals in hair dyes are not recommended as they cause increase hair dryness which can increase hair loss during pregnancy.  " Highlighting" and permanents are allowed.  Dye may be absorbed differently and permanents may not hold as well during pregnancy.  Sunbathing:  Use a sunscreen, as skin burns easily during pregnancy.  Drink plenty of fluids; avoid over heating.  Tanning Beds:  Because their possible side effects are still unknown, tanning beds are not recommended.  Ultrasound Scans:  Routine ultrasounds are performed  at approximately 20 weeks.  You will be able to see your baby's general anatomy an if you would like to know the gender this can usually be determined as well.  If it is questionable when you conceived you may also receive an ultrasound early in your pregnancy for dating purposes.  Otherwise ultrasound exams are not routinely performed unless there is a medical necessity.  Although you can request a scan we ask that you pay for it when   conducted because insurance does not cover " patient request" scans.  Work: If your pregnancy proceeds without complications you may work until your due date, unless your physician or employer advises otherwise.  Round Ligament Pain/Pelvic Discomfort:  Sharp, shooting pains not associated with bleeding are fairly common, usually occurring in the second trimester of pregnancy.  They tend to be worse when standing up or when you remain standing for long periods of time.  These are the result of pressure of certain pelvic ligaments called "round ligaments".  Rest, Tylenol and heat seem to be the most effective relief.  As the womb and fetus grow, they rise out of the pelvis and the discomfort improves.  Please notify the office if your pain seems different than that described.  It may represent a more serious condition.  Common Medications Safe in Pregnancy  Acne:      Constipation:  Benzoyl Peroxide     Colace  Clindamycin      Dulcolax Suppository  Topica Erythromycin     Fibercon  Salicylic Acid      Metamucil         Miralax AVOID:        Senakot   Accutane    Cough:  Retin-A       Cough Drops  Tetracycline      Phenergan w/ Codeine if Rx  Minocycline      Robitussin (Plain & DM)  Antibiotics:     Crabs/Lice:  Ceclor       RID  Cephalosporins    AVOID:  E-Mycins      Kwell  Keflex  Macrobid/Macrodantin   Diarrhea:  Penicillin      Kao-Pectate  Zithromax      Imodium AD         PUSH FLUIDS AVOID:       Cipro     Fever:  Tetracycline      Tylenol (Regular  or Extra  Minocycline       Strength)  Levaquin      Extra Strength-Do not          Exceed 8 tabs/24 hrs Caffeine:        <200mg/day (equiv. To 1 cup of coffee or  approx. 3 12 oz sodas)         Gas: Cold/Hayfever:       Gas-X  Benadryl      Mylicon  Claritin       Phazyme  **Claritin-D        Chlor-Trimeton    Headaches:  Dimetapp      ASA-Free Excedrin  Drixoral-Non-Drowsy     Cold Compress  Mucinex (Guaifenasin)     Tylenol (Regular or Extra  Sudafed/Sudafed-12 Hour     Strength)  **Sudafed PE Pseudoephedrine   Tylenol Cold & Sinus     Vicks Vapor Rub  Zyrtec  **AVOID if Problems With Blood Pressure         Heartburn: Avoid lying down for at least 1 hour after meals  Aciphex      Maalox     Rash:  Milk of Magnesia     Benadryl    Mylanta       1% Hydrocortisone Cream  Pepcid  Pepcid Complete   Sleep Aids:  Prevacid      Ambien   Prilosec       Benadryl  Rolaids       Chamomile Tea  Tums (Limit 4/day)     Unisom           Tylenol PM         Warm milk-add vanilla or  Hemorrhoids:       Sugar for taste  Anusol/Anusol H.C.  (RX: Analapram 2.5%)  Sugar Substitutes:  Hydrocortisone OTC     Ok in moderation  Preparation H      Tucks        Vaseline lotion applied to tissue with wiping    Herpes:     Throat:  Acyclovir      Oragel  Famvir  Valtrex     Vaccines:         Flu Shot Leg Cramps:       *Gardasil  Benadryl      Hepatitis A         Hepatitis B Nasal Spray:       Pneumovax  Saline Nasal Spray     Polio Booster         Tetanus Nausea:       Tuberculosis test or PPD  Vitamin B6 25 mg TID   AVOID:    Dramamine      *Gardasil  Emetrol       Live Poliovirus  Ginger Root 250 mg QID    MMR (measles, mumps &  High Complex Carbs @ Bedtime    rebella)  Sea Bands-Accupressure    Varicella (Chickenpox)  Unisom 1/2 tab TID     *No known complications           If received before Pain:         Known pregnancy;   Darvocet       Resume series  after  Lortab        Delivery  Percocet    Yeast:   Tramadol      Femstat  Tylenol 3      Gyne-lotrimin  Ultram       Monistat  Vicodin           MISC:         All Sunscreens           Hair Coloring/highlights          Insect Repellant's          (Including DEET)         Mystic Tans  

## 2023-03-25 NOTE — Progress Notes (Signed)
I contacted the patient via phone. I advised contacting centralized scheduling. I gave the patient the contact number. She will contact them for scheduling. The patient is also aware to contact our office back for scheduling of her NOB phys.

## 2023-03-25 NOTE — Progress Notes (Signed)
New OB Intake  I connected with  Charlene Combs on 03/25/23 at  3:15 PM EDT by telephone  and verified that I am speaking with the correct person using two identifiers. Nurse is located at [facility] and pt is located at home.  I explained I am completing New OB Intake today. We discussed her EDD of 11/05/2023 that is based on LMP of 01/29/2023. Pt is G3/P2002. I reviewed her allergies, medications, Medical/Surgical/OB history, and appropriate screenings. There are no cats in the home.  Based on history, this is a/an pregnancy uncomplicated .   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  Anatomy US Explained first scheduled [place] will be scheduled soon and an anatomy scan will be done at 20 weeks.  Labs Discussed genetic screening with patient. Patient declines genetic testing to be drawn at new OB visit. Discussed possible labs to be drawn at new OB appointment.  COVID Vaccine Patient has not had COVID vaccine.   Social Determinants of Health Food Insecurity: denies food insecurity Transportation: Patient denies transportation needs. Childcare: Discussed no children allowed at ultrasound appointments.   First visit review I reviewed new OB appt with pt. I explained she will have ob bloodwork and pap smear/pelvic exam if indicated. Explained pt will be seen by an AOB provider at first visit; encounter routed to appropriate provider.   Loran Senters, [place] 03/25/2023  3:40 PM

## 2023-04-20 ENCOUNTER — Telehealth: Payer: 59 | Admitting: Physician Assistant

## 2023-04-20 DIAGNOSIS — S6710XA Crushing injury of unspecified finger(s), initial encounter: Secondary | ICD-10-CM

## 2023-04-20 NOTE — Progress Notes (Signed)
Virtual Visit Consent   ALIYHA Combs, you are scheduled for a virtual visit with a [facility] provider today. Just as with appointments in the office, your consent must be obtained to participate. Your consent will be active for this visit and any virtual visit you may have with one of our providers in the next 365 days. If you have a MyChart account, a copy of this consent can be sent to you electronically.  As this is a virtual visit, video technology does not allow for your provider to perform a traditional examination. This may limit your provider's ability to fully assess your condition. If your provider identifies any concerns that need to be evaluated in person or the need to arrange testing (such as labs, EKG, etc.), we will make arrangements to do so. Although advances in technology are sophisticated, we cannot ensure that it will always work on either your end or our end. If the connection with a video visit is poor, the visit may have to be switched to a telephone visit. With either a video or telephone visit, we are not always able to ensure that we have a secure connection.  By engaging in this virtual visit, you consent to the provision of healthcare and authorize for your insurance to be billed (if applicable) for the services provided during this visit. Depending on your insurance coverage, you may receive a charge related to this service.  I need to obtain your verbal consent now. Are you willing to proceed with your visit today? [name] has provided verbal consent on 04/20/2023 for a virtual visit (video or telephone). Charlene Combs, [place]  Date: 04/20/2023 12:52 PM  Virtual Visit via Video Note   I, Charlene Combs, connected with  MA OURSLER  ([id number], March 13, 2001) on 04/20/23 at 12:45 PM EDT by a video-enabled telemedicine application and verified that I am speaking with the correct person using two identifiers.  Location: Patient: Virtual Visit  Location Patient: Home Provider: Virtual Visit Location Provider: Home Office   I discussed the limitations of evaluation and management by telemedicine and the availability of in person appointments. The patient expressed understanding and agreed to proceed.    History of Present Illness: [name] is a 22 y.o. who identifies as a female who was assigned female at birth, and is being seen today for injury to distal R third phalanx last night as she slammed it into a door which completely shut. Notes substantial pain and swelling with bruising of the area and under her nail. Denies any numbness, tingling. Notes some decreased ROM of end of her finger. Nail has not lifted up per her report. Patient is currently pregnant.  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 (Patient not taking: Reported on 03/25/2023), Disp: , Rfl:    benzocaine-Menthol (DERMOPLAST) 20-0.5 % AERO, Apply 1 Application topically as needed for irritation (perineal discomfort). (Patient not taking: Reported on 03/25/2023), Disp: , Rfl:    coconut oil OIL, Apply 1 Application topically as  needed. (Patient not taking: Reported on 03/25/2023), Disp: , Rfl: 0   diphenhydrAMINE (BENADRYL) 25 mg capsule, Take 1 capsule (25 mg total) by mouth every 6 (six) hours as needed for itching. (Patient not taking: Reported on 03/25/2023), Disp: 30 capsule, Rfl: 0   ferrous sulfate (FERROUSUL) 325 (65 FE) MG tablet, Take 1 tablet (325 mg total) by mouth daily with breakfast. (Patient not taking: Reported on 03/25/2023),  Disp: 100 tablet, Rfl: 3   ibuprofen (ADVIL) 600 MG tablet, Take 1 tablet (600 mg total) by mouth every 6 (six) hours. (Patient not taking: Reported on 03/25/2023), Disp: 30 tablet, Rfl: 0   ondansetron (ZOFRAN-ODT) 4 MG disintegrating tablet, Take 4 mg by mouth as needed., Disp: , Rfl:    Prenat-FeFum-FePo-FA-Omega 3 (CONCEPT DHA) 53.5-38-1 MG CAPS, Take 1 capsule by mouth daily., Disp: 90 capsule, Rfl: 3   senna-docusate (SENOKOT-S) 8.6-50 MG tablet, Take 2 tablets by mouth daily. (Patient not taking: Reported on 03/25/2023), Disp: , Rfl:    simethicone (MYLICON) 80 MG chewable tablet, Chew 1 tablet (80 mg total) by mouth as needed for flatulence. (Patient not taking: Reported on 03/25/2023), Disp: 30 tablet, Rfl: 0   witch hazel-glycerin (TUCKS) pad, Apply 1 Application topically as needed for hemorrhoids. (Patient not taking: Reported on 03/25/2023), Disp: 40 each, Rfl: 12  Observations/Objective: Patient is well-developed, well-nourished in no acute distress.  Resting comfortably  at home.  Head is normocephalic, atraumatic.  No labored breathing.  Speech is clear and coherent with logical content.  Patient is alert and oriented at baseline.  R third phalanx viewed. Redness, bruising and swelling from DIP distally. Subungual hematoma noted without any lifting of the nail. Unable flex at DIP. Is able to fully extend the joint, though.  Assessment and Plan: 1. Crushing injury of finger of right hand  Discussed concern for possible fracture versus contusion and ligamentous injury at DIP. Recommend Ice and Tylenol. Elevate when able. She needs in person assessment. Ortho/sports medicine urgent care resources given so she can seek evaluation today.  Follow Up Instructions: I discussed the assessment and treatment plan with the patient. The patient was provided an opportunity to ask questions and all were answered. The patient agreed with the plan and demonstrated an understanding of the  instructions.  A copy of instructions were sent to the patient via MyChart unless otherwise noted below.   The patient was advised to call back or seek an in-person evaluation if the symptoms worsen or if the condition fails to improve as anticipated.  Time:  I spent 10 minutes with the patient via telehealth technology discussing the above problems/concerns.    Charlene Climes, PA-C

## 2023-04-20 NOTE — Patient Instructions (Addendum)
Charlene Combs, thank you for joining [name], PA-C for today's virtual visit.  While this provider is not your primary care provider (PCP), if your PCP is located in our provider database this encounter information will be shared with them immediately following your visit.   A [facility] MyChart account gives you access to today's visit and all your visits, tests, and labs performed at [facility] " click here if you don't have a [facility] MyChart account or go to [email or web address]  Consent: (Patient) Charlene Combs provided verbal consent for this virtual visit at the beginning of the encounter.  Current Medications:  Current Outpatient Medications:    acetaminophen (TYLENOL) 325 MG tablet, Take 2 tablets (650 mg total) by mouth every 4 (four) hours as needed (for pain scale < 4). (Patient not taking: Reported on 03/25/2023), Disp: , Rfl:    benzocaine-Menthol (DERMOPLAST) 20-0.5 % AERO, Apply 1 Application topically as needed for irritation (perineal discomfort). (Patient not taking: Reported on 03/25/2023), Disp: , Rfl:    coconut oil OIL, Apply 1 Application topically as needed. (Patient not taking: Reported on 03/25/2023), Disp: , Rfl: 0   diphenhydrAMINE (BENADRYL) 25 mg capsule, Take 1 capsule (25 mg total) by mouth every 6 (six) hours as needed for itching. (Patient not taking: Reported on 03/25/2023), Disp: 30 capsule, Rfl: 0   ferrous sulfate (FERROUSUL) 325 (65 FE) MG tablet, Take 1 tablet (325 mg total) by mouth daily with breakfast. (Patient not taking: Reported on 03/25/2023), Disp: 100 tablet, Rfl: 3   ibuprofen (ADVIL) 600 MG tablet, Take 1 tablet (600 mg total) by mouth every 6 (six) hours. (Patient not taking: Reported on 03/25/2023), Disp: 30 tablet, Rfl: 0   ondansetron (ZOFRAN-ODT) 4 MG disintegrating tablet, Take 4 mg by mouth as needed., Disp: , Rfl:    Prenat-FeFum-FePo-FA-Omega 3 (CONCEPT DHA) 53.5-38-1 MG CAPS, Take 1 capsule by mouth  daily., Disp: 90 capsule, Rfl: 3   senna-docusate (SENOKOT-S) 8.6-50 MG tablet, Take 2 tablets by mouth daily. (Patient not taking: Reported on 03/25/2023), Disp: , Rfl:    simethicone (MYLICON) 80 MG chewable tablet, Chew 1 tablet (80 mg total) by mouth as needed for flatulence. (Patient not taking: Reported on 03/25/2023), Disp: 30 tablet, Rfl: 0   witch hazel-glycerin (TUCKS) pad, Apply 1 Application topically as needed for hemorrhoids. (Patient not taking: Reported on 03/25/2023), Disp: 40 each, Rfl: 12   Medications ordered in this encounter:  No orders of the defined types were placed in this encounter.    *If you need refills on other medications prior to your next appointment, please contact your pharmacy*  Follow-Up: Call back or seek an in-person evaluation if the symptoms worsen or if the condition fails to improve as anticipated.  [facility] [phone]  Other Instructions Ice. Elevate the finger. Tylenol. Use resources below to be evaluated in person within 24 hours.   [facility] -- Same-Day Injury Clinic   Monday - Friday   11AM-7PM   Schedule via website (see below) or as walk-in   [address]   [place], [place] [address]   [phone]   [facility]  [place], [place]  [place]      [facility]   [address]  [place], [place] [address]   GET DIRECTIONS   CONTACT   Phone: [phone]   Phone: [phone]   URGENT CARE HOURS   Monday - Friday:   8:00am  to 8:00pm   Saturday:   10:00am to 3:00pm         [facility]   [address]  [place], [place] [address]   GET DIRECTIONS   CONTACT   Phone: [phone]   URGENT CARE HOURS   Monday:   9:00AM - 9:00PM   Tuesday:   9:00AM - 9:00PM   Wednesday:   9:00AM - 9:00PM   Thursday:   9:00AM - 9:00PM   Friday:   9:00AM - 9:00PM   Saturday:   9:00AM -  9:00PM   Sunday:   9:00AM - 9:00PM   HOLIDAY HOURS   Holidays:   8:30AM - 4:30PM      [facility]   After Hours [facility] [phone]      Orthopedic Urgent [address] [place], [place] [address]      EVENINGS & WEEKENDS NO APPOINTMENT NECESSARY Mon-Fri 5:30PM - 9PM    Sat 9 AM - 2 PM Sun 10 AM - 2 PM      [facility]   [address]  [address]  [place], [place] [address]   [phone]      OFFICE HOURS:   MONDAY - FRIDAY : 8:00 A.M. - 4:00 P.M.      [facility]  [address]  [place], [place] [address]   [phone]      OFFICE HOURS:   MONDAY - FRIDAY: 8:00 A.M. - 8:30 P.M.   SATURDAY: 10:00 A.M. - 2:00 P.M.          If you have been instructed to have an in-person evaluation today at a local Urgent Care facility, please use the link below. It will take you to a list of all of our available [facility], including address, phone number and hours of operation. Please do not delay care.  [facility]  If you or a family member do not have a primary care provider, use the link below to schedule a visit and establish care. When you choose a [facility] primary care physician or advanced practice provider, you gain a long-term partner in health. Find a Primary Care Provider  Learn more about [facility]'s in-office and virtual care options: [facility] - Get Care Now

## 2023-04-21 ENCOUNTER — Ambulatory Visit (INDEPENDENT_AMBULATORY_CARE_PROVIDER_SITE_OTHER): Payer: 59 | Admitting: Obstetrics and Gynecology

## 2023-04-21 ENCOUNTER — Other Ambulatory Visit: Payer: Self-pay | Admitting: Certified Nurse Midwife

## 2023-04-21 ENCOUNTER — Ambulatory Visit (INDEPENDENT_AMBULATORY_CARE_PROVIDER_SITE_OTHER): Payer: 59

## 2023-04-21 ENCOUNTER — Telehealth: Payer: Self-pay | Admitting: Obstetrics and Gynecology

## 2023-04-21 VITALS — BP 106/76 | HR 80 | Wt 169.0 lb

## 2023-04-21 DIAGNOSIS — Z3A01 Less than 8 weeks gestation of pregnancy: Secondary | ICD-10-CM

## 2023-04-21 DIAGNOSIS — Z369 Encounter for antenatal screening, unspecified: Secondary | ICD-10-CM

## 2023-04-21 DIAGNOSIS — Z3A09 9 weeks gestation of pregnancy: Secondary | ICD-10-CM | POA: Diagnosis not present

## 2023-04-21 DIAGNOSIS — Z3491 Encounter for supervision of normal pregnancy, unspecified, first trimester: Secondary | ICD-10-CM

## 2023-04-21 DIAGNOSIS — Z3480 Encounter for supervision of other normal pregnancy, unspecified trimester: Secondary | ICD-10-CM

## 2023-04-21 DIAGNOSIS — Z3687 Encounter for antenatal screening for uncertain dates: Secondary | ICD-10-CM | POA: Diagnosis not present

## 2023-04-21 DIAGNOSIS — O021 Missed abortion: Secondary | ICD-10-CM | POA: Diagnosis not present

## 2023-04-21 MED ORDER — MISOPROSTOL 200 MCG PO TABS: ORAL_TABLET | ORAL | 2 refills | Status: DC

## 2023-04-21 MED ORDER — HYDROCODONE-ACETAMINOPHEN 5-325 MG PO TABS: 1.0000 | ORAL_TABLET | ORAL | 0 refills | Status: DC | PRN

## 2023-04-21 NOTE — Patient Instructions (Signed)
CYTOTEC (MISOPROSTOL) INSTRUCTIONS   Who should not take this medication?  . If you have an allergy to Cytotec   What are the side effects of the Cytotec?  . Menstrual cramping or contractions . Vaginal bleeding-soaking a maxi pad within 1 hour is considered excessive bleeding and you should go to the emergency room for further evaluation . Headache . Abdominal pain . Nausea or vomiting.  Small frequent meals, sucking on hard sugar-free candy, or chewing sugar-free gum may help.  . Diarrhea   How do I take Cytotec?  Once the pregnancy has remained to be an.,  He has 3 options for treatment.  You have chosen medical management.  You will be given a prescription for Cytotec 800 mcg in four 200 mcg.  We will place 4 tablets within your vagina (as high in your vagina as she can place them) and you may place a tampon (or just lie down) to ensure the pills do not fall out.  Your pregnancy will begin to pass in about 8 to 12 hours after placement of the tablets.  Cramping, abdominal pain, and bleeding is common.  This is how the medication works.  You may take ibuprofen 800 mg every 8 hours for mild discomfort.  If a prescription of Vicodin or Percocet was given, you may also take this medication every 4-6 hours for moderate to severe pain.   If you do not have significant bleeding within 4 hours after placement of Cytotec, you should repeat this process the next day.  If you pass the pregnancy, please make sure you have a follow-up appointment with our office in 1 week.  To ensure complete passage of the pregnancy with either an ultrasound or blood tests at this visit.  Expect bleeding for 1 to 3 weeks after the miscarriage passes, however this bleeding should be light.  If your blood type is Rh negative, please ensure you receive your RhoGam injection within 72 hours of bleeding  If your bleeding stops maxi pad completely within 1 hour, your bleeding to heavily, and should go to the emergency  room for further evaluation.  Cytotec is 86% effective within the first 24 hours and 94% effective within 48 hours with a repeat dose.  If you have no significant bleeding within 48 hours, please call our office.

## 2023-04-21 NOTE — Progress Notes (Signed)
Subjective:    Charlene Combs is a 22 y.o. G3P2002 at [redacted]w[redacted]d who initially presented for establishment of prenatal care, however ultrasound performed just prior to visit notes missed abortion at [redacted] weeks gestation. Patient denies any complaints of vaginal bleeding or cramping. Notes that she suspected that something might be going on with the pregnancy as she has not really had any pregnancy symptoms this time.    The following portions of the patient's history were reviewed and updated as appropriate:   She  has a past medical history of Anemia affecting pregnancy (02/08/2022), Encounter for induction of labor (05/05/2022), febrile seizures, Headache, History of migraine headaches, and Supervision of other normal pregnancy, antepartum (10/12/2021).  She  has a past surgical history that includes No past surgeries.  Her family history includes Anxiety disorder in her sister; Bipolar disorder in her maternal grandmother and mother; Depression in her father, mother, and sister; Gestational diabetes in her mother; Healthy in her brother, maternal grandfather, paternal grandfather, paternal grandmother, sister, sister, and sister; Hypertension in her father; Seizures in her maternal grandmother and mother; Sickle cell trait in her brother and sister; Stomach cancer in her maternal grandmother.  She  reports that she has never smoked. She has never been exposed to tobacco smoke. She has never used smokeless tobacco. She reports that she does not currently use alcohol. She reports that she does not currently use drugs after having used the following drugs: Marijuana.  Current Outpatient Medications on File Prior to Visit  Medication Sig Dispense Refill   acetaminophen (TYLENOL) 325 MG tablet Take 2 tablets (650 mg total) by mouth every 4 (four) hours as needed (for pain scale < 4).     benzocaine-Menthol (DERMOPLAST) 20-0.5 % AERO Apply 1 Application topically as needed for irritation (perineal  discomfort).     coconut oil OIL Apply 1 Application topically as needed.  0   diphenhydrAMINE (BENADRYL) 25 mg capsule Take 1 capsule (25 mg total) by mouth every 6 (six) hours as needed for itching. 30 capsule 0   ferrous sulfate (FERROUSUL) 325 (65 FE) MG tablet Take 1 tablet (325 mg total) by mouth daily with breakfast. 100 tablet 3   ibuprofen (ADVIL) 600 MG tablet Take 1 tablet (600 mg total) by mouth every 6 (six) hours. 30 tablet 0   ondansetron (ZOFRAN-ODT) 4 MG disintegrating tablet Take 4 mg by mouth as needed.     Prenat-FeFum-FePo-FA-Omega 3 (CONCEPT DHA) 53.5-38-1 MG CAPS Take 1 capsule by mouth daily. 90 capsule 3   senna-docusate (SENOKOT-S) 8.6-50 MG tablet Take 2 tablets by mouth daily.     simethicone (MYLICON) 80 MG chewable tablet Chew 1 tablet (80 mg total) by mouth as needed for flatulence. 30 tablet 0   witch hazel-glycerin (TUCKS) pad Apply 1 Application topically as needed for hemorrhoids. 40 each 12   No current facility-administered medications on file prior to visit.   She has No Known Allergies..  Review of Systems Pertinent items noted in HPI and remainder of comprehensive ROS otherwise negative.   Objective:     BP 106/76   Pulse 80   Wt 169 lb (76.7 kg)   LMP 01/29/2023   BMI 29.01 kg/m  General:   alert and no distress  Heart: regular rate and rhythm, S1, S2 normal, no murmur, click, rub or gallop  Lungs: clear to auscultation bilaterally  Abdomen: soft, non-tender, without masses or organomegaly  Pelvic: Exam deferred.         Extremities:  Non-tender, no edema    Labs:  Lab Results  Component Value Date   ABORH O POS 05/05/2022    Imaging:  ULTRASOUND REPORT   Location: [facility] Date of Service: 04/21/2023    Indications:dating Findings:  M[place] Jim intrauterine pregnancy is visualized with a CRL consistent with [redacted]w[redacted]d gestation, giving an (U/S) EDD of 11/24/2023. The (U/S) EDD is not consistent with the clinically  established EDD of 11/05/2023.   FHR: not visualized  CRL measurement: 22.8 mm Yolk sac is visualized and appears normal. Amnion: vizualized and appears abnormal    Right Ovary is not well seen Left Ovary is not well seen. Corpus luteal cyst:  is not visualized Survey of the adnexa demonstrates no adnexal masses. There is no free peritoneal fluid in the cul de sac.   Impression: 1. [redacted]w[redacted]d nonviable Singleton Intrauterine pregnancy by U/S. 2. (U/S) EDD is not consistent with Clinically established EDD of 11/05/2023.   Recommendations: 1.Clinical correlation with the patient's History and Physical Exam. 2. Follow up with provider for recommendations   Waldo Laine, RT    Assessment:   Missed abortion at 9 weeks  Plan:   - Discussed management of missed abortion: expectant management vs misoprostol vs D&E.  Risks and benefits of all modalities discussed; all questions answered.  Patient opted for medical management. Risks and benefits of medical management were carefully explained, including a success rate of 80-90%, the need for another person to be at home with her, and to call/come in if she had heavy bleeding, dizziness, or severe pain not relieved by medication.  She will follow up in one week after misoprostol administration; if there has been no passage of pregnancy, will consider repeat misoprostol vs D&E.  Patient should again be advised to call or come in for evaluation for any concerning symptoms; bleeding precautions should be strictly emphasized. BHCG levels.   If patient changes her mind and opts for D&E, advised to  call the office and this will be scheduled for her sometime in the subsequent few days depending on surgeon and OR availability. Risks of surgery including bleeding, infection, injury to surrounding organs, need for additional procedures, possibility of intrauterine scarring which may impair future fertility, risk of retained products which may require further  management and other postoperative/anesthesia complications will be explained to patient.   Hildred Laser, MD  OB/GYN at [facility]

## 2023-04-21 NOTE — Telephone Encounter (Signed)
Contacted patient via phone to schedule follow up with repeat ultrasound in 2 weeks. No answer left VM with contact information for [facility].

## 2023-04-22 ENCOUNTER — Encounter: Payer: Self-pay | Admitting: Obstetrics and Gynecology

## 2023-04-22 LAB — BETA HCG QUANT (REF LAB): hCG Quant: 2770 m[IU]/mL

## 2023-04-25 ENCOUNTER — Telehealth: Payer: 59

## 2023-05-04 ENCOUNTER — Ambulatory Visit: Payer: 59

## 2023-05-05 ENCOUNTER — Encounter: Payer: Self-pay | Admitting: Obstetrics and Gynecology

## 2023-05-05 ENCOUNTER — Ambulatory Visit: Admission: RE | Admit: 2023-05-05 | Payer: 59 | Source: Ambulatory Visit

## 2023-05-05 NOTE — Telephone Encounter (Signed)
Missed appointment letter sent via my chart.  I contacted the patient via phone x2, no answer, I left message asking the patient to contact our office for rescheduled.

## 2023-05-05 NOTE — Progress Notes (Deleted)
    GYNECOLOGY PROGRESS NOTE  Subjective:    Patient ID: Charlene Combs, female    DOB: [birth year], 22 y.o.   MRN: [id number]  HPI  Patient is a 22 y.o. G30P2002 female who presents for miscarriage follow up.   {Common ambulatory SmartLinks:19316}  Review of Systems {ros; complete:30496}   Objective:   Last menstrual period 01/29/2023, not currently breastfeeding. There is no height or weight on file to calculate BMI. General appearance: {general exam:16600} Abdomen: {abdominal exam:16834} Pelvic: {pelvic exam:16852::"cervix normal in appearance","external genitalia normal","no adnexal masses or tenderness","no cervical motion tenderness","rectovaginal septum normal","uterus normal size, shape, and consistency","vagina normal without discharge"} Extremities: {extremity exam:5109} Neurologic: {neuro exam:17854}   Assessment:   1. Missed abortion      Plan:   There are no diagnoses linked to this encounter.     Hildred Laser, MD  [facility]

## 2023-05-10 ENCOUNTER — Ambulatory Visit: Payer: 59 | Admitting: Obstetrics and Gynecology

## 2023-05-10 DIAGNOSIS — O021 Missed abortion: Secondary | ICD-10-CM

## 2023-06-22 DIAGNOSIS — M940 Chondrocostal junction syndrome [Tietze]: Secondary | ICD-10-CM | POA: Diagnosis not present

## 2023-06-22 DIAGNOSIS — J069 Acute upper respiratory infection, unspecified: Secondary | ICD-10-CM | POA: Diagnosis not present

## 2023-06-22 DIAGNOSIS — R059 Cough, unspecified: Secondary | ICD-10-CM | POA: Diagnosis not present

## 2023-08-15 ENCOUNTER — Encounter: Payer: Self-pay | Admitting: Advanced Practice Midwife

## 2023-08-15 ENCOUNTER — Ambulatory Visit: Payer: 59 | Admitting: Advanced Practice Midwife

## 2023-08-15 ENCOUNTER — Ambulatory Visit: Payer: 59

## 2023-08-15 DIAGNOSIS — Z113 Encounter for screening for infections with a predominantly sexual mode of transmission: Secondary | ICD-10-CM

## 2023-08-15 LAB — HM HEPATITIS C SCREENING LAB: HM Hepatitis Screen: NEGATIVE

## 2023-08-15 LAB — WET PREP FOR TRICH, YEAST, CLUE
Trichomonas Exam: NEGATIVE
Yeast Exam: NEGATIVE

## 2023-08-15 LAB — HM HIV SCREENING LAB: HM HIV Screening: NEGATIVE

## 2023-08-15 NOTE — Progress Notes (Signed)
 Pt here for STI screening.  Wet mount results reviewed with patient.  Condoms declined-Tempestt Silba, RN

## 2023-08-15 NOTE — Progress Notes (Signed)
 [facility] Department STI clinic [address] [place] [address] Main phone: [phone]  STI screening visit  Subjective:  Charlene Combs is a 23 y.o. SBF G3P2 exvaper  female being seen today for an STI screening visit. The patient reports they do have symptoms.  Patient reports that they do not desire a pregnancy in the next year.   They reported they are not interested in discussing contraception today.    Patient's last menstrual period was 07/22/2023.  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 LMP 07/22/23. Last sex 06/2023 without condom; with current partner x 5 years; 1 partner in last 3 months. Last cig age 50. Last vaped 2023. Last MJ 2023. Last ETOH 08/02/23 (1 shot liquor). Last pap 01/02/21 neg.   Does the patient using douching products? No  Last HIV test per patient/review of record was  Lab Results  Component Value Date   HMHIVSCREEN Negative - Validated 07/01/2022    Lab Results  Component Value Date   HIV Non-reactive 01/04/2018     Last HEPC test per patient/review of record was No results found for: HMHEPCSCREEN No components found for: HEPC   Last HEPB test per patient/review of record was No components found for: HMHEPBSCREEN No components found for: HEPC   Patient reports last pap was: 01/02/21 neg No results found for: [facility], ADEQPAP Lab Results   Component Value Date   SPECADGYN Comment 01/02/2021   Result Date Procedure Results Follow-ups  01/02/2021 Pap IG (Image Guided) DIAGNOSIS:: Comment Specimen adequacy:: Comment Clinician Provided ICD10: Comment Performed by:: Comment PAP Smear Comment: . Note:: Comment Test Methodology: Comment     Screening for MPX risk: Does the patient have an unexplained rash? No Is the patient MSM? No Does the patient endorse multiple sex partners or anonymous sex partners? No Did the patient have close or sexual contact with a person diagnosed with MPX? No Has the patient traveled outside the [place]  where MPX is endemic? No Is there a high clinical suspicion for MPX-- evidenced by one of the following No  -Unlikely to be chickenpox  -Lymphadenopathy  -Rash that present in same phase of evolution on any given body part See flowsheet for further details and programmatic requirements.   Immunization history:  Immunization History  Administered Date(s) Administered   Tdap 02/08/2022     The following portions of the patient's history were reviewed and updated as appropriate: allergies, current medications, past medical history, past social history, past surgical history and problem list.  Objective:  There were no vitals filed for this visit.  Physical Exam Vitals and nursing note reviewed.  Constitutional:      Appearance: Normal appearance. She is normal weight.  HENT:     Head: Normocephalic and atraumatic.     Mouth/Throat:     Mouth: Mucous membranes are  moist.     Pharynx: Oropharynx is clear. No oropharyngeal exudate or posterior oropharyngeal erythema.  Eyes:     Conjunctiva/sclera: Conjunctivae normal.  Pulmonary:     Effort: Pulmonary effort is normal.  Abdominal:     General: Abdomen is flat.     Palpations: Abdomen is soft. There is no mass.     Tenderness: There is no abdominal tenderness. There is no rebound.     Comments: Soft without masses or tenderness, good tone   Genitourinary:    General: Normal vulva.     Exam position: Lithotomy position.     Pubic Area: No rash or pubic lice.      Labia:        Right: No rash or lesion.        Left: No rash or lesion.      Vagina: Vaginal discharge (white creamy leukorrhea, ph<4.5) present. No erythema, bleeding or lesions.     Cervix: No cervical motion tenderness, discharge, friability, lesion or erythema.     Uterus: Normal.      Adnexa: Right adnexa normal and left adnexa normal.     Rectum: Normal.       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     Left side of head: No preauricular or posterior auricular adenopathy.     Cervical: No cervical adenopathy.     Right cervical: No superficial, deep or posterior cervical adenopathy.    Left cervical: No superficial, deep or posterior cervical adenopathy.     Upper Body:     Right upper body: No supraclavicular, axillary or epitrochlear adenopathy.     Left upper body: No supraclavicular, axillary or epitrochlear adenopathy.     Lower Body: No right inguinal adenopathy. No left inguinal adenopathy.  Skin:    General: Skin is warm and dry.     Findings: No rash.  Neurological:     Mental Status: She is alert and oriented to person, place, and time.    Assessment and Plan:  Charlene Combs is a 23 y.o. female presenting to the [facility] Department for STI screening  1. Screening examination for venereal disease (Primary) Treat wet mount per standing orders Immunization nurse consult Suggestions given for sore area on perineum; HSV culture done  - WET PREP FOR TRICH, YEAST, CLUE - Chlamydia/Gonorrhea [facility] - Gonococcus culture - Syphilis Serology, [facility] - HIV/HCV [facility]   Patient accepted all screenings including oral, vaginal  CT/GC and bloodwork for HIV/RPR, and wet prep. Patient meets criteria for HepB screening? Yes. Ordered? no Patient meets criteria for HepC screening? Yes. Ordered? yes  Treat wet prep per standing order Discussed time line for [facility] results and that patient will be called with positive results and encouraged patient to call if she had not heard in 2 weeks.  Counseled to return or seek care for continued or worsening symptoms Recommended repeat testing in 3 months with positive results. Recommended condom use with all sex for STI prevention.   Patient is currently using  nothing  to prevent pregnancy.    No follow-ups on file.  No future appointments.  Almarie DELENA Hillier, CNM

## 2023-08-17 ENCOUNTER — Encounter: Payer: Self-pay | Admitting: Advanced Practice Midwife

## 2023-08-17 ENCOUNTER — Telehealth: Payer: Self-pay

## 2023-08-17 DIAGNOSIS — B009 Herpesviral infection, unspecified: Secondary | ICD-10-CM | POA: Insufficient documentation

## 2023-08-17 NOTE — Telephone Encounter (Signed)
 PW verified.   Appointment made for 1/16 @ 8:15 am.  Ferrell Hu, RN

## 2023-08-18 ENCOUNTER — Ambulatory Visit: Payer: 59

## 2023-08-19 LAB — GONOCOCCUS CULTURE

## 2023-08-22 NOTE — Patient Instructions (Signed)
Herpes outbreak Start valacyclovir 1 gram by mouth once daily for 5 days You have refills to treat future outbreaks If you have more than 4-5 outbreaks in a year, we can consider suppression therapy with medicine taken every day  Therapy You can use the following website to start looking for a therapist. Remember, finding a therapist you click with it a lot like speed dating - you have to sort through a bunch of lemons before finding what you like!  https://www.psychologytoday.com/us Search for [place], [place] (or city of your choice).  On the next page, you will see filter options at the top.  You may filter therapists by insurance they take, issues you would like to work on, or therapy types.   https://[email or web address]

## 2023-08-23 ENCOUNTER — Ambulatory Visit: Payer: Medicaid Other | Admitting: Family Medicine

## 2023-08-23 DIAGNOSIS — B009 Herpesviral infection, unspecified: Secondary | ICD-10-CM

## 2023-08-23 DIAGNOSIS — B379 Candidiasis, unspecified: Secondary | ICD-10-CM

## 2023-08-23 MED ORDER — FLUCONAZOLE 150 MG PO TABS: 150.0000 mg | ORAL_TABLET | ORAL | 0 refills | Status: DC

## 2023-08-23 MED ORDER — VALACYCLOVIR HCL 1 G PO TABS: 1000.0000 mg | ORAL_TABLET | Freq: Every day | ORAL | 3 refills | Status: AC

## 2023-08-23 NOTE — Progress Notes (Signed)
[facility] Department STI clinic [address], [address] [place] [address] Main phone: [phone]  STI follow up visit  Subjective:  GARNET SPAKES is a 23 y.o. female being seen today for an STI follow up visit.   Patient's last menstrual period was 08/18/2023.  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   Last HIV test per patient/review of record was  Lab Results  Component Value Date   HMHIVSCREEN Negative - Validated 08/15/2023    Lab Results  Component Value Date   HIV Non-reactive 01/04/2018     Last HEPC test per patient/review of record was  Lab Results  Component Value Date   HMHEPCSCREEN Negative-Validated 08/15/2023   No components found for: "HEPC"   Last HEPB test per patient/review of record was No components found for: "HMHEPBSCREEN" No components found for: "HEPC"   Patient reports last pap was:   No results found for: "DIAGPAP", "HPVHIGH", "ADEQPAP" Lab Results  Component Value Date   SPECADGYN Comment 01/02/2021   Result Date Procedure Results Follow-ups  01/02/2021 Pap IG (Image Guided) DIAGNOSIS:: Comment Specimen adequacy:: Comment Clinician Provided ICD10: Comment Performed by:: Comment PAP Smear Comment: . Note:: Comment Test Methodology: Comment     Screening for MPX risk: Does the patient have an unexplained rash? No Is the patient MSM? No Does the patient endorse multiple sex partners or anonymous sex partners? No Did the patient have  close or sexual contact with a person diagnosed with MPX? No Has the patient traveled outside the [place] where MPX is endemic? No Is there a high clinical suspicion for MPX-- evidenced by one of the following No  -Unlikely to be chickenpox  -Lymphadenopathy  -Rash that present in same phase of evolution on any given body part See flowsheet for further details and programmatic requirements.   Immunization history:  Immunization History  Administered Date(s) Administered   Tdap 02/08/2022     The following portions of the patient's history were reviewed and updated as appropriate: allergies, current medications, past medical history, past social history, past surgical history and problem list.  Objective:  There were no vitals filed for this visit.  Physical Exam Vitals and nursing note reviewed.  Constitutional:      Appearance: Normal appearance.  HENT:     Head: Normocephalic and atraumatic.  Eyes:     General: No scleral icterus.       Right eye: No discharge.        Left eye: No discharge.  Pulmonary:     Effort: Pulmonary effort is normal.  Lymphadenopathy:     Head:     Right side of head: No preauricular or posterior auricular adenopathy.     Left side of head: No preauricular or posterior auricular adenopathy.     Cervical: No cervical adenopathy.     Upper Body:     Right upper body: No supraclavicular, axillary or epitrochlear adenopathy.  Left upper body: No supraclavicular, axillary or epitrochlear adenopathy.  Skin:    General: Skin is warm and dry.     Findings: No rash.  Neurological:     Mental Status: She is alert and oriented to person, place, and time.     Assessment and Plan:  KEEANNA FREISE is a 23 y.o. female presenting to the [facility] Department for STI screening  1. Routine cultures positive for HSV2 (Primary) Counseled regarding diagnosis, prevention of spread, and how to treat outbreaks with episodic valtrex treatment.  -  valACYclovir (VALTREX) 1000 MG tablet; Take 1 tablet (1,000 mg total) by mouth daily for 5 days. Please refill and have ready for treatment of next herpes outbreak.  Dispense: 5 tablet; Refill: 3  2. Yeast infection Empiric treatment despite negative wet prep at last appointment.  - fluconazole (DIFLUCAN) 150 MG tablet; Take 1 tablet (150 mg total) by mouth every 3 (three) days.  Dispense: 2 tablet; Refill: 0   No follow-ups on file.  No future appointments.  Clydene Fake, MD

## 2023-08-23 NOTE — Progress Notes (Signed)
Pt is here for discussion with provider on her results. Condoms declined. Sonda Primes, RN.

## 2024-06-05 ENCOUNTER — Ambulatory Visit
Admission: EM | Admit: 2024-06-05 | Discharge: 2024-06-05 | Disposition: A | Discharge status: Home / Self Care | Attending: Emergency Medicine | Admitting: Emergency Medicine

## 2024-06-05 DIAGNOSIS — M62830 Muscle spasm of back: Secondary | ICD-10-CM

## 2024-06-05 DIAGNOSIS — T148XXA Other injury of unspecified body region, initial encounter: Secondary | ICD-10-CM

## 2024-06-05 DIAGNOSIS — M7918 Myalgia, other site: Secondary | ICD-10-CM

## 2024-06-05 MED ORDER — IBUPROFEN 600 MG PO TABS: 600.0000 mg | ORAL_TABLET | Freq: Four times a day (QID) | ORAL | 0 refills | Status: DC | PRN

## 2024-06-05 MED ORDER — TIZANIDINE HCL 4 MG PO TABS: 4.0000 mg | ORAL_TABLET | Freq: Three times a day (TID) | ORAL | 0 refills | Status: DC | PRN

## 2024-06-05 NOTE — ED Provider Notes (Signed)
 HPI  SUBJECTIVE:  Charlene Combs is a 23 y.o. female who presents with constant diffuse muscle aches over her neck, entire back, bilateral arms and legs after moving to heavy couches with 1 other person yesterday.  She estimates that it took about 20 to 30 minutes to move both couches.  No trauma to the neck or back, change in the color of her urine, saddle anesthesia, urinary fecal incontinence, urinary retention, leg weakness/numbness/tingling, bilateral radicular pain.  She has had symptoms like this before with overuse/muscle strains.  No antipyretic in the past 6 hours.  She tried Tylenol  1000 mg with temporary improvement in her symptoms.  Symptoms are worse with movement.  She has no past medical history.  No history of rhabdomyolysis.  LMP: 1 week ago.  Denies the possibility of being pregnant.  PCP: None  Past Medical History:  Diagnosis Date   Anemia affecting pregnancy 02/08/2022   Encounter for induction of labor 05/05/2022   febrile seizures    At age 28   Headache    Migraines   History of migraine headaches    Supervision of other normal pregnancy, antepartum 10/12/2021    Nursing Staff Provider Office Location  [facility] Dating  On 10/19/21@10  5/7=04/28/22 Language  English Anatomy US   On 01/19/22@23  6/7=04/27/22 Flu Vaccine  Declined flu vaccine: 10/12/2021, 04/23/2022 Genetic Screen  NIPS:   AFP:   First Screen:  Quad:11/09/21=neg   TDaP vaccine   02/08/2022 Hgb A1C or  GTT Early  Third trimester  =  118     (02/08/22) COVID vaccine Declines, 10/12/2021   Rhogam     LAB RESUL    Past Surgical History:  Procedure Laterality Date   NO PAST SURGERIES      Family History  Problem Relation Age of Onset   Bipolar disorder Mother    Depression Mother    Gestational diabetes Mother    Seizures Mother    Hypertension Father    Depression Father    Anxiety disorder Sister    Depression Sister    Sickle cell trait Sister    Healthy Sister    Healthy Sister    Healthy Sister    Sickle  cell trait Brother    Healthy Brother    Seizures Maternal Grandmother    Bipolar disorder Maternal Grandmother    Stomach cancer Maternal Grandmother    Healthy Maternal Grandfather    Healthy Paternal Grandmother    Healthy Paternal Grandfather     Social History   Tobacco Use   Smoking status: Former    Types: Cigarettes, E-cigarettes    Passive exposure: Never   Smokeless tobacco: Never  Vaping Use   Vaping status: Former   Quit date: 08/25/2021   Substances: Nicotine   Devices: stopped last week when found out preg, startted at age 27 per patient  Substance Use Topics   Alcohol use: Not Currently    Alcohol/week: 1.0 standard drink of alcohol    Types: 1 Shots of liquor per week    Comment: last use 08/02/23   Drug use: Not Currently    Types: Marijuana    Comment: stopped 08/25/2021 when found out preg, smoking mj 1-2 x /day    No current facility-administered medications for this encounter.  Current Outpatient Medications:    ibuprofen  (ADVIL ) 600 MG tablet, Take 1 tablet (600 mg total) by mouth every 6 (six) hours as needed., Disp: 30 tablet, Rfl: 0   tiZANidine (ZANAFLEX) 4 MG tablet, Take  1 tablet (4 mg total) by mouth every 8 (eight) hours as needed for muscle spasms., Disp: 30 tablet, Rfl: 0  No Known Allergies   ROS  As noted in HPI.   Physical Exam  BP 112/72 (BP Location: Left Arm)   Pulse (!) 59   Temp 98.1 F (36.7 C) (Oral)   Resp 18   Wt 73.1 kg   LMP 05/30/2024 (Exact Date)   SpO2 100%   BMI 27.67 kg/m   Constitutional: Well developed, well nourished, no acute distress Eyes:  EOMI, conjunctiva normal bilaterally HENT: Normocephalic, atraumatic,mucus membranes moist Respiratory: Normal inspiratory effort Cardiovascular: Normal rate GI: nondistended. No suprapubic tenderness skin: No rash, skin intact Musculoskeletal: Bilateral trapezial tenderness, muscle spasm.  Diffuse tenderness over the bilateral thoracic spine, bilateral  paralumbar tenderness.  No C, T, L-spine tenderness.  Diffuse tenderness over entire bilateral arms and calves.  No tenderness over the quadriceps or anterior tibialis.  No SI joint, sacral bony tenderness. baseline ROM with intact PT pulses.  Sensation between thighs intact. No pain with passive int/ext rotation, flex/extension, AD/ABduction hips bilaterally. SLR neg bilaterally. Sensation baseline light touch bilaterally for Pt, DTR's symmetric and intact bilaterally KJ J, Motor symmetric bilateral 5/5 hip flexion, quadriceps, hamstrings, EHL, foot dorsiflexion, foot plantarflexion, gait normal  neurologic: Alert & oriented x 3, no focal neuro deficits Psychiatric: Speech and behavior appropriate   ED Course   Medications - No data to display  No orders of the defined types were placed in this encounter.   No results found for this or any previous visit (from the past 24 hours). No results found.  ED Clinical Impression  1. Muscle strain   2. Myalgia, multiple sites   3. Muscle spasm of back     ED Assessment/Plan     I considered rhabdomyolysis, but I think that it is low likelihood so deferring UA, CK, BMP at this time.  Presentation consistent with diffuse myalgias from overuse with some trapezius muscle spasm.  Home with Tylenol  combined with ibuprofen  3-4 times a day and Zanaflex.  Advised heat, Epsom salt soaks, gentle stretching, massage.  She declined a work note.  Will give her a primary care list to choose from for routine care.  She is to go to the ER for any changes in her urine color, for severe pains that do not respond to the Tylenol /ibuprofen , Zanaflex, or for any other concerns  Discussed MDM, treatment plan, and plan for follow-up with patient.  patient agrees with plan.   Meds ordered this encounter  Medications   ibuprofen  (ADVIL ) 600 MG tablet    Sig: Take 1 tablet (600 mg total) by mouth every 6 (six) hours as needed.    Dispense:  30 tablet    Refill:  0    tiZANidine (ZANAFLEX) 4 MG tablet    Sig: Take 1 tablet (4 mg total) by mouth every 8 (eight) hours as needed for muscle spasms.    Dispense:  30 tablet    Refill:  0    *This clinic note was created using Scientist, clinical (histocompatibility and immunogenetics). Therefore, there may be occasional mistakes despite careful proofreading.  ?     Van Knee, MD 06/05/24 [phone]

## 2024-06-05 NOTE — Discharge Instructions (Signed)
 Take 1000 mg of Tylenol  combined with 600 mg of ibuprofen  together 3-4 times a day as needed for pain.  Zanaflex will help with muscle spasms.  Heat, Epsom salt soaks, gentle stretching and massage.  Here is a list of primary care providers who are taking new patients:  [facility] [place] Dr. Selinda Ku (sports medicine) Dr. Harlene Duos Rolan Hoyle, PA Dr. Mackey Ny Dr. Harlene Saddler [address] [place] [address] [phone]  [facility] [address] [place], [place] [address] [phone]  [facility] [place] [address] [place] [address]  [phone]  [facility] [address] [place], [place] [address] [phone]  [facility] [address] [place]  [phone] [place], [place] [address]  Here are clinics/ other resources who will see you if you do not have insurance. Some have certain criteria that you must meet. Call them and find out what they are:  [facility]: [address] [place], [place] [address] Phone: [phone] Hours: First and Third Saturdays of each Month, 9 a.m. - 1 p.m.  [facility]: [address] [place], [place] [address] Phone: [phone] Hours: Tuesday, 4 p.m. - 8 p.m. Thursday, 1 p.m. - 8 p.m. Wednesday, 9 a.m. - [facility] [address] [place], [place] [address] Phone: [phone] Pharmacy Phone Number: [phone] Dental Phone Number: [phone] [facility] Insurance Help: [phone]  Dental Hours: Monday - Thursday, 8 a.m. - 6 p.m.  [facility] Bl[facility] [address], [place], [place] [address] Phone: [phone] Pharmacy Phone Number: [phone] [facility] Insurance Help: [phone]  [facility] [address] [place], [place] [address] Phone: [phone] Pharmacy Phone Number: [phone] [facility] Insurance Help: [phone]  [facility] [address] [place], [place] [address] Phone: [phone] [facility] Insurance Help: [phone]   [facility] [address] [place], [place] [address] Phone: [phone]  Go to www.goodrx.com  or www.costplusdrugs.com to look up your medications. This will give you a list of where you can find your prescriptions at the most affordable prices. Or ask the pharmacist what the cash price is, or if they have any other discount programs available to help make your medication more affordable. This can be less expensive than what you would pay with insurance.

## 2024-06-05 NOTE — ED Triage Notes (Signed)
 Patient states that she was moving couches yesterday down some steps. Patient states that she's having back pain all the way from her neck to her legs. Tylenol  helps some.

## 2024-06-11 ENCOUNTER — Ambulatory Visit (INDEPENDENT_AMBULATORY_CARE_PROVIDER_SITE_OTHER): Admitting: Family Medicine

## 2024-06-11 ENCOUNTER — Encounter: Payer: Self-pay | Admitting: Family Medicine

## 2024-06-11 VITALS — BP 109/72 | HR 83 | Resp 16 | Ht 64.0 in | Wt 158.0 lb

## 2024-06-11 DIAGNOSIS — Z7689 Persons encountering health services in other specified circumstances: Secondary | ICD-10-CM

## 2024-06-11 DIAGNOSIS — Z862 Personal history of diseases of the blood and blood-forming organs and certain disorders involving the immune mechanism: Secondary | ICD-10-CM | POA: Diagnosis not present

## 2024-06-11 DIAGNOSIS — E739 Lactose intolerance, unspecified: Secondary | ICD-10-CM

## 2024-06-11 DIAGNOSIS — F39 Unspecified mood [affective] disorder: Secondary | ICD-10-CM | POA: Diagnosis not present

## 2024-06-11 DIAGNOSIS — Z0000 Encounter for general adult medical examination without abnormal findings: Secondary | ICD-10-CM

## 2024-06-11 NOTE — Progress Notes (Signed)
 New Patient Office Visit  Subjective   Patient ID: Charlene Combs, female    DOB: 09-Oct-2000  Age: 23 y.o. MRN: [id number]  CC:  Chief Complaint  Patient presents with   Establish Care   Discussed the use of AI scribe software for clinical note transcription with the patient, who gave verbal consent to proceed.  History of Present Illness    Charlene Combs is a 23 year old female who presents to establish care with [facility] at [facility]   She experiences difficulty controlling her emotions, particularly irritation and anger over minor issues, such as her daughter crying. She becomes 'instantly irritated' and 'angry' with crying and other small triggers. She has not been formally diagnosed with anxiety or depression but is interested in understanding her emotional responses better. There is a family history of borderline personality/bipolar disorder in her mother and grandmother, and she thinks her sister has a severe mental health condition also- but she remains undiagnosed.   She has a history of anemia during her pregnancies and feels that she may still have it. She is currently taking over-the-counter vitamins and iron supplements. Her last pregnancy was two years ago, and she experienced a miscarriage in August of last year.  She has a history of migraines but states they are infrequent, with only one severe episode this year that involved vomiting and the need to lie down. Her mother also suffers from migraines and is on medication, but she does not feel she needs medication as her migraines are not frequent.  She thinks she has developed lactose intolerance around the age of 31, similar to her father and sister. Her symptoms include fluctuating between diarrhea and constipation, gas, and cramping after consuming dairy products. She has switched to lactose-free products but occasionally consumes regular dairy, which she finds difficult to avoid  completely.  Socially, she used to smoke but has switched to vaping, which contains nicotine. She is not using any contraception currently.         06/11/2024    8:55 AM  GAD 7 : Generalized Anxiety Score  Nervous, Anxious, on Edge 0  Control/stop worrying 1  Worry too much - different things 1  Trouble relaxing 0  Restless 0  Easily annoyed or irritable 3  Afraid - awful might happen 0  Total GAD 7 Score 5      06/11/2024    8:55 AM 03/31/2022    1:34 PM 10/12/2021    2:11 PM  PHQ9 SCORE ONLY  PHQ-9 Total Score 4 0  1      Data saved with a previous flowsheet row definition     Outpatient Encounter Medications as of 06/11/2024  Medication Sig   [DISCONTINUED] ibuprofen  (ADVIL ) 600 MG tablet Take 1 tablet (600 mg total) by mouth every 6 (six) hours as needed. (Patient not taking: Reported on 06/11/2024)   [DISCONTINUED] tiZANidine (ZANAFLEX) 4 MG tablet Take 1 tablet (4 mg total) by mouth every 8 (eight) hours as needed for muscle spasms. (Patient not taking: Reported on 06/11/2024)   No facility-administered encounter medications on file as of 06/11/2024.    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   Family history of autism    History of seizures as a child    Elevated troponin 12/16/2014   Past Medical History:  Diagnosis Date   Anemia affecting pregnancy 02/08/2022   Encounter for  induction of labor 05/05/2022   febrile seizures    At age 24   Headache    Migraines   History of migraine headaches    Supervision of other normal pregnancy, antepartum 10/12/2021    Nursing Staff Provider Office Location  [facility] Dating  On 10/19/21@12  5/7=04/28/22 Language  English Anatomy US   On 01/19/22@23  6/7=04/27/22 Flu Vaccine  Declined flu vaccine: 10/12/2021, 04/23/2022 Genetic Screen  NIPS:   AFP:   First Screen:  Quad:11/09/21=neg   TDaP vaccine   02/08/2022 Hgb A1C or  GTT Early  Third trimester  =  118      (02/08/22) COVID vaccine Declines, 10/12/2021   Rhogam     LAB RESUL   Past Surgical History:  Procedure Laterality Date   NO PAST SURGERIES     Family History  Problem Relation Age of Onset   Bipolar disorder Mother    Depression Mother    Gestational diabetes Mother    Seizures Mother    Migraines Mother    Hypertension Father    Depression Father    Heart disease Father    Heart failure Father 38       LVAD   Anxiety disorder Sister    Depression Sister    Sickle cell trait Sister    Healthy Sister    Healthy Sister    Healthy Sister    Sickle cell trait Brother    Healthy Brother    Seizures Maternal Grandmother    Bipolar disorder Maternal Grandmother    Stomach cancer Maternal Grandmother    Healthy Maternal Grandfather    Healthy Paternal Grandmother    Healthy Paternal Grandfather    Social History   Socioeconomic History   Marital status: Single    Spouse name: Not on file   Number of children: 2   Years of education: 12   Highest education level: High school graduate  Occupational History   Occupation: fast food  Tobacco Use   Smoking status: Former    Types: Cigarettes, E-cigarettes    Passive exposure: Never   Smokeless tobacco: Never  Vaping Use   Vaping status: Every Day   Substances: Nicotine  Substance and Sexual Activity   Alcohol use: Not Currently    Alcohol/week: 1.0 standard drink of alcohol    Types: 1 Shots of liquor per week    Comment: last use 08/02/23   Drug use: Not Currently    Types: Marijuana    Comment: stopped 08/25/2021 when found out preg, smoking mj 1-2 x /day   Sexual activity: Yes    Partners: Male    Birth control/protection: None  Other Topics Concern   Not on file  Social History Narrative   Patient here with FOB. Lives with FOB and her daughter from previous relationship.   Social Drivers of Health   Financial Resource Strain: High Risk (03/25/2023)   Overall Financial Resource Strain (CARDIA)    Difficulty  of Paying Living Expenses: Hard  Food Insecurity: No Food Insecurity (03/25/2023)   Hunger Vital Sign    Worried About Running Out of Food in the Last Year: Never true    Ran Out of Food in the Last Year: Never true  Transportation Needs: No Transportation Needs (03/25/2023)   PRAPARE - Administrator, Civil Service (Medical): No    Lack of Transportation (Non-Medical): No  Physical Activity: Sufficiently Active (03/25/2023)   Exercise Vital Sign    Days of Exercise per Week: 7 days  Minutes of Exercise per Session: 30 min  Stress: No Stress Concern Present (03/25/2023)   [employer] of Occupational Health - Occupational Stress Questionnaire    Feeling of Stress : Not at all  Social Connections: Unknown (03/25/2023)   Social Connection and Isolation Panel    Frequency of Communication with Friends and Family: More than three times a week    Frequency of Social Gatherings with Friends and Family: Twice a week    Attends Religious Services: Never    Database Administrator or Organizations: No    Attends Banker Meetings: Never    Marital Status: Not on file  Intimate Partner Violence: Not At Risk (03/25/2023)   Humiliation, Afraid, Rape, and Kick questionnaire    Fear of Current or Ex-Partner: No    Emotionally Abused: No    Physically Abused: No    Sexually Abused: No   Outpatient Medications Prior to Visit  Medication Sig Dispense Refill   ibuprofen  (ADVIL ) 600 MG tablet Take 1 tablet (600 mg total) by mouth every 6 (six) hours as needed. (Patient not taking: Reported on 06/11/2024) 30 tablet 0   tiZANidine (ZANAFLEX) 4 MG tablet Take 1 tablet (4 mg total) by mouth every 8 (eight) hours as needed for muscle spasms. (Patient not taking: Reported on 06/11/2024) 30 tablet 0   No facility-administered medications prior to visit.   No Known Allergies  ROS: see HPI    Objective  Today's Vitals   06/11/24 0846  BP: 109/72  Pulse: 83  Resp: 16  SpO2:  93%  Weight: 158 lb (71.7 kg)  Height: 5' 4 (1.626 m)  PainSc: 0-No pain   GENERAL: Well-appearing, in NAD. Well nourished.  SKIN: Pink, warm and dry. No rash, lesion, ulceration, or ecchymoses.  Head: Normocephalic. NECK: Trachea midline. Full ROM w/o pain or tenderness. No lymphadenopathy.  EARS: Tympanic membranes are intact, translucent without bulging and without drainage. Appropriate landmarks visualized.  EYES: Conjunctiva clear without exudates. EOMI, PERRL, no drainage present.  NOSE: Septum midline w/o deformity. Nares patent, mucosa pink and non-inflamed w/o drainage. No sinus tenderness.  THROAT: Uvula midline. Oropharynx clear. Tonsils non-inflamed without exudate. Mucous membranes pink and moist.  RESPIRATORY: Chest wall symmetrical. Respirations even and non-labored. Breath sounds clear to auscultation bilaterally.  CARDIAC: S1, S2 present, regular rate and rhythm without murmur or gallops. Peripheral pulses 2+ bilaterally.  MSK: Muscle tone and strength appropriate for age. Joints w/o tenderness, redness, or swelling.  EXTREMITIES: Without clubbing, cyanosis, or edema.  NEUROLOGIC: No motor or sensory deficits. Steady, even gait. C2-C12 intact.  PSYCH/MENTAL STATUS: Alert, oriented x 3. Cooperative, appropriate mood and affect.    Assessment & Plan:   1. Encounter to establish care (Primary) Patient is a 89- year-old female who presents today to establish care with primary care at [facility]. Reviewed the past medical history, family history, social history, surgical history, medications and allergies today- updates made as indicated. Patient has concerns today regarding the following:    2. Mood disorder Reports irritability, anger, mild anxiety, and depression. Denies SI/HI. Family history of borderline personality disorder. Mild scores on screening. Referred to psychiatry for further evaluation due to family history.  - Ambulatory referral to Psychiatry  3. Healthcare  maintenance Routine wellness visit to establish care. Schedule physical examination with pap smear. - CBC with Differential/Platelet - Comprehensive metabolic panel with GFR - Hemoglobin A1c - Lipid panel - TSH Rfx on Abnormal to Free T4 - Celiac Ab tTG DGP  TIgA  4. History of anemia History of anemia during pregnancy with current symptoms suggestive of persistent anemia. Ordered complete blood count.  5. Lactose intolerance Symptoms since September when she ingests food with lactose, and she does have a family history of lactose intolerance. Experiences diarrhea, constipation, gas, and cramping after dairy. Will check for gluten intolerance. Discussed lactose intolerance test.    Return for Physical & Pap .   Evalene Arts, FNP

## 2024-06-11 NOTE — Patient Instructions (Signed)
 Smoking Cessation: QuitlineNC 1-800-QUIT-NOW 670-772-4699); Espaol: 1-855-Djelo-Ya (1-737-271-8849) [email or web address]-castaneda.[email or web address]

## 2024-06-13 LAB — CBC WITH DIFFERENTIAL/PLATELET
Basophils Absolute: 0.1 x10E3/uL (ref 0.0–0.2)
Basos: 1 %
EOS (ABSOLUTE): 0.1 x10E3/uL (ref 0.0–0.4)
Eos: 2 %
Hematocrit: 40.2 % (ref 34.0–46.6)
Hemoglobin: 12.8 g/dL (ref 11.1–15.9)
Immature Grans (Abs): 0 x10E3/uL (ref 0.0–0.1)
Immature Granulocytes: 0 %
Lymphocytes Absolute: 1.9 x10E3/uL (ref 0.7–3.1)
Lymphs: 31 %
MCH: 27.2 pg (ref 26.6–33.0)
MCHC: 31.8 g/dL (ref 31.5–35.7)
MCV: 86 fL (ref 79–97)
Monocytes Absolute: 0.5 x10E3/uL (ref 0.1–0.9)
Monocytes: 8 %
Neutrophils Absolute: 3.5 x10E3/uL (ref 1.4–7.0)
Neutrophils: 58 %
Platelets: 341 x10E3/uL (ref 150–450)
RBC: 4.7 x10E6/uL (ref 3.77–5.28)
RDW: 14.4 % (ref 11.7–15.4)
WBC: 6 x10E3/uL (ref 3.4–10.8)

## 2024-06-13 LAB — COMPREHENSIVE METABOLIC PANEL WITH GFR
ALT: 14 IU/L (ref 0–32)
AST: 21 IU/L (ref 0–40)
Albumin: 4.8 g/dL (ref 4.0–5.0)
Alkaline Phosphatase: 79 IU/L (ref 41–116)
BUN/Creatinine Ratio: 14 (ref 9–23)
BUN: 9 mg/dL (ref 6–20)
Bilirubin Total: 0.8 mg/dL (ref 0.0–1.2)
CO2: 19 mmol/L — ABNORMAL LOW (ref 20–29)
Calcium: 9.9 mg/dL (ref 8.7–10.2)
Chloride: 103 mmol/L (ref 96–106)
Creatinine, Ser: 0.65 mg/dL (ref 0.57–1.00)
Globulin, Total: 2.8 g/dL (ref 1.5–4.5)
Glucose: 94 mg/dL (ref 70–99)
Potassium: 4.4 mmol/L (ref 3.5–5.2)
Sodium: 139 mmol/L (ref 134–144)
Total Protein: 7.6 g/dL (ref 6.0–8.5)
eGFR: 125 mL/min/1.73 (ref >59)

## 2024-06-13 LAB — CELIAC AB TTG DGP TIGA
Deamidated Gliadin Abs, IgA: 4 U (ref 0–19)
Deamidated Gliadin Abs, IgG: 2 U (ref 0–19)
Immunoglobulin A, (IgA) QN, Serum: 239 mg/dL (ref 87–352)
t-Transglutaminase (tTG) IgA: <2 U/mL (ref 0–3)
t-Transglutaminase (tTG) IgG: 4 U/mL (ref 0–5)

## 2024-06-13 LAB — HEMOGLOBIN A1C
Est. average glucose Bld gHb Est-mCnc: 117 mg/dL
Hgb A1c MFr Bld: 5.7 % — ABNORMAL HIGH (ref 4.8–5.6)

## 2024-06-13 LAB — LIPID PANEL
Chol/HDL Ratio: 3.1 ratio (ref 0.0–4.4)
Cholesterol, Total: 151 mg/dL (ref 100–199)
HDL: 49 mg/dL (ref >39)
LDL Chol Calc (NIH): 89 mg/dL (ref 0–99)
Triglycerides: 64 mg/dL (ref 0–149)
VLDL Cholesterol Cal: 13 mg/dL (ref 5–40)

## 2024-06-13 LAB — TSH RFX ON ABNORMAL TO FREE T4: TSH: 1.14 u[IU]/mL (ref 0.450–4.500)

## 2024-06-15 ENCOUNTER — Ambulatory Visit: Payer: Self-pay | Admitting: Family Medicine

## 2024-07-03 ENCOUNTER — Ambulatory Visit: Admitting: Family Medicine

## 2024-07-03 ENCOUNTER — Encounter: Payer: Self-pay | Admitting: Family Medicine

## 2024-07-03 ENCOUNTER — Other Ambulatory Visit (HOSPITAL_COMMUNITY)
Admission: RE | Admit: 2024-07-03 | Discharge: 2024-07-03 | Disposition: A | Discharge status: Home / Self Care | Source: Ambulatory Visit | Attending: Family Medicine | Admitting: Family Medicine

## 2024-07-03 VITALS — BP 127/77 | HR 74 | Resp 16 | Ht 64.0 in | Wt 156.0 lb

## 2024-07-03 DIAGNOSIS — Z124 Encounter for screening for malignant neoplasm of cervix: Secondary | ICD-10-CM | POA: Diagnosis not present

## 2024-07-03 DIAGNOSIS — Z23 Encounter for immunization: Secondary | ICD-10-CM

## 2024-07-03 DIAGNOSIS — E739 Lactose intolerance, unspecified: Secondary | ICD-10-CM

## 2024-07-03 DIAGNOSIS — N898 Other specified noninflammatory disorders of vagina: Secondary | ICD-10-CM

## 2024-07-03 DIAGNOSIS — Z7185 Encounter for immunization safety counseling: Secondary | ICD-10-CM

## 2024-07-03 DIAGNOSIS — Z0000 Encounter for general adult medical examination without abnormal findings: Secondary | ICD-10-CM | POA: Diagnosis not present

## 2024-07-03 NOTE — Patient Instructions (Signed)
 Health Maintenance Recommendations Screening Testing Mammogram Every 1 -2 years based on history and risk factors Starting at age 25 Please call to schedule: [facility]  [address], [place], [place] [address] Phone: [phone] [facility] [address], [place], [place] [address] Phone: [phone] Pap Smear Ages 21-39 every 3 years Ages 32-65 every 5 years with HPV testing More frequent testing may be required based on results and history Colon Cancer Screening Every 1-10 years based on test performed, risk factors, and history Starting at age 47 Bone Density Screening Every 2-10 years based on history Starting at age 34 for women Recommendations for men differ based on medication usage, history, and risk factors AAA Screening One time ultrasound Men 44-26 years old who have every smoked Lung Cancer Screening Low Dose Lung CT every 12 months Age 14-80 years with a 30 pack-year smoking history who still smoke or who have quit within the last 15 years   Screening Labs Routine  Labs: Complete Blood Count (CBC), Complete Metabolic Panel (CMP), Cholesterol (Lipid Panel) Every 6-12 months based on history and medications May be recommended more frequently based on current conditions or previous results Hemoglobin A1c Lab Every 3-12 months based on history and previous results Starting at age 82 or earlier with diagnosis of diabetes, high cholesterol, BMI >26, and/or risk factors Frequent monitoring for patients with diabetes to ensure blood sugar control Thyroid  Panel (TSH w/ T3 & T4) Every 6 months based on history, symptoms, and risk factors May be repeated more often if on medication HIV One time testing for all patients 76 and older May be repeated more frequently for patients with increased risk factors or exposure Hepatitis C One time testing for all patients 13 and older May be repeated more frequently for patients with increased risk factors or  exposure Gonorrhea, Chlamydia Every 12 months for all sexually active persons 13-24 years Additional monitoring may be recommended for those who are considered high risk or who have symptoms PSA Men 100-42 years old with risk factors Additional screening may be recommended from age 3-69 based on risk factors, symptoms, and history   Vaccine Recommendations Tetanus Booster All adults every 10 years Flu Vaccine All patients 6 months and older every year COVID Vaccine All patients 12 years and older Initial dosing with booster May recommend additional booster based on age and health history HPV Vaccine 2 doses all patients age 53-26 Dosing may be considered for patients over 26 Shingles Vaccine (Shingrix) 2 doses all adults 55 years and older Pneumonia (Pneumovax 87) All adults 65 years and older May recommend earlier dosing based on health history Pneumonia (Prevnar 47) All adults 65 years and older Dosed 1 year after Pneumovax 23   Additional Screening, Testing, and Vaccinations may be recommended on an individualized basis based on family history, health history, risk factors, and/or exposure.  __________________________________________________________   Diet Recommendations for All Patients   I recommend that all patients maintain a diet low in saturated fats, carbohydrates, and cholesterol. While this can be challenging at first, it is not impossible and small changes can make big differences.  Things to try: Decreasing the amount of soda, sweet tea, and/or juice to one or less per day and replace with water  While water  is always the first choice, if you do not like water  you may consider adding a water  additive without sugar to improve the taste other sugar free drinks Replace potatoes with a brightly colored vegetable at dinner  Use healthy oils, such as canola oil or olive oil, instead of butter or hard margarine Limit your bread intake to two pieces or less a  day Replace regular pasta with low carb pasta options Bake, broil, or grill foods instead of frying Monitor portion sizes  Eat smaller, more frequent meals throughout the day instead of large meals   An important thing to remember is, if you love foods that are not great for your health, you don't have to give them up completely. Instead, allow these foods to be a reward when you have done well. Allowing yourself to still have special treats every once in a while is a nice way to tell yourself thank you for working hard to keep yourself healthy.    Also remember that every day is a new day. If you have a bad day and fall off the wagon, you can still climb right back up and keep moving along on your journey!   We have resources available to help you!  Some websites that may be helpful include: www.http://www.wall-moore.info/        Www.VeryWellFit.com _____________________________________________________________   Activity Recommendations for All Patients   I recommend that all adults get at least 20 minutes of moderate physical activity that elevates your heart rate at least 5 days out of the week.  Some examples include: Walking or jogging at a pace that allows you to carry on a conversation Cycling (stationary bike or outdoors) Water  aerobics Yoga Weight lifting Dancing If physical limitations prevent you from putting stress on your joints, exercise in a pool or seated in a chair are excellent options.   Do determine your MAXIMUM heart rate for activity: YOUR AGE - 220 = MAX HeartRate    Remember! Do not push yourself too hard.  Start slowly and build up your pace, speed, weight, time in exercise, etc.  Allow your body to rest between exercise and get good sleep. You will need more water  than normal when you are exerting yourself. Do not wait until you are thirsty to drink. Drink with a purpose of getting in at least 8, 8 ounce glasses of water  a day plus more depending on how much you exercise  and sweat.      If you begin to develop dizziness, chest pain, abdominal pain, jaw pain, shortness of breath, headache, vision changes, lightheadedness, or other concerning symptoms, stop the activity and allow your body to rest. If your symptoms are severe, seek emergency evaluation immediately. If your symptoms are concerning, but not severe, please let us  know so that we can recommend further evaluation.

## 2024-07-03 NOTE — Progress Notes (Signed)
 Subjective:   Charlene Combs 2001-03-18  07/03/2024   CC: Chief Complaint  Patient presents with   Annual Exam   HPI: Charlene Combs is a 23 y.o. female who presents for a routine health maintenance exam.  Fasting labs collected at previous visit and discussed today.   HEALTH SCREENINGS: - Vision Screening: up to date - Dental Visits: up to date - Pap smear: pap done today  - Breast Exam: pap done today - STD Screening: Ordered today - Mammogram (40+): Not applicable  - Colonoscopy (45+): Not applicable  - Bone Density (65+ or under 65 with predisposing conditions): Not applicable  - Lung CA screening with low-dose CT:  Not applicable Adults age 72-80 who are current cigarette smokers or quit within the last 15 years. Must have 20 pack year history.   Depression and Anxiety Screen done today and results listed below:     07/03/2024   10:27 AM 06/11/2024    8:55 AM 03/31/2022    1:34 PM 10/12/2021    2:11 PM 08/31/2021    3:43 PM  Depression screen PHQ 2/9  Decreased Interest 0 0 0 0 0  Down, Depressed, Hopeless 0 2 0 0 0  PHQ - 2 Score 0 2 0 0 0  Altered sleeping 0 0 0 0   Tired, decreased energy 0 2 0 1   Change in appetite 0 0 0 0   Feeling bad or failure about yourself  0 0 0 0   Trouble concentrating 0 0 0 0   Moving slowly or fidgety/restless 0 0 0 0   Suicidal thoughts 0 0 0 0   PHQ-9 Score 0 4 0  1    Difficult doing work/chores Not difficult at all  Not difficult at all       Data saved with a previous flowsheet row definition      07/03/2024   10:27 AM 06/11/2024    8:55 AM  GAD 7 : Generalized Anxiety Score  Nervous, Anxious, on Edge 0 0  Control/stop worrying 0 1  Worry too much - different things 0 1  Trouble relaxing 0 0  Restless 0 0  Easily annoyed or irritable 0 3  Afraid - awful might happen 0 0  Total GAD 7 Score 0 5  Anxiety Difficulty Not difficult at all     IMMUNIZATIONS: - Tdap: Tetanus vaccination status reviewed: last tetanus  booster within 10 years. - HPV: Administered today - Influenza: Administered today  - Pneumovax: Not applicable - Prevnar 20: Not applicable - Zostavax (50+): Not applicable  Past medical history, surgical history, medications, allergies, family history and social history reviewed with patient today and changes made to appropriate areas of the chart.   Past Medical History:  Diagnosis Date   Anemia affecting pregnancy 02/08/2022   Encounter for induction of labor 05/05/2022   febrile seizures    At age 7   Headache    Migraines   History of migraine headaches    Supervision of other normal pregnancy, antepartum 10/12/2021    Nursing Staff Provider Office Location  [facility] Dating  On 10/19/21@12  5/7=04/28/22 Language  English Anatomy US   On 01/19/22@23  6/7=04/27/22 Flu Vaccine  Declined flu vaccine: 10/12/2021, 04/23/2022 Genetic Screen  NIPS:   AFP:   First Screen:  Quad:11/09/21=neg   TDaP vaccine   02/08/2022 Hgb A1C or  GTT Early  Third trimester  =  118     (02/08/22) COVID vaccine Declines, 10/12/2021  Rhogam     LAB RESUL    Past Surgical History:  Procedure Laterality Date   NO PAST SURGERIES      No current outpatient medications on file prior to visit.   No current facility-administered medications on file prior to visit.    No Known Allergies   Social History   Socioeconomic History   Marital status: Single    Spouse name: Not on file   Number of children: 2   Years of education: 12   Highest education level: High school graduate  Occupational History   Occupation: fast food  Tobacco Use   Smoking status: Former    Types: Cigarettes, E-cigarettes    Passive exposure: Never   Smokeless tobacco: Never  Vaping Use   Vaping status: Every Day   Substances: Nicotine  Substance and Sexual Activity   Alcohol use: Not Currently    Alcohol/week: 1.0 standard drink of alcohol    Types: 1 Shots of liquor per week    Comment: last use 08/02/23   Drug use: Not Currently     Types: Marijuana    Comment: stopped 08/25/2021 when found out preg, smoking mj 1-2 x /day   Sexual activity: Yes    Partners: Male    Birth control/protection: None  Other Topics Concern   Not on file  Social History Narrative   Patient here with FOB. Lives with FOB and her daughter from previous relationship.   Social Drivers of Health   Financial Resource Strain: High Risk (03/25/2023)   Overall Financial Resource Strain (CARDIA)    Difficulty of Paying Living Expenses: Hard  Food Insecurity: No Food Insecurity (03/25/2023)   Hunger Vital Sign    Worried About Running Out of Food in the Last Year: Never true    Ran Out of Food in the Last Year: Never true  Transportation Needs: No Transportation Needs (03/25/2023)   PRAPARE - Administrator, Civil Service (Medical): No    Lack of Transportation (Non-Medical): No  Physical Activity: Sufficiently Active (03/25/2023)   Exercise Vital Sign    Days of Exercise per Week: 7 days    Minutes of Exercise per Session: 30 min  Stress: No Stress Concern Present (03/25/2023)   [employer] of Occupational Health - Occupational Stress Questionnaire    Feeling of Stress : Not at all  Social Connections: Unknown (03/25/2023)   Social Connection and Isolation Panel    Frequency of Communication with Friends and Family: More than three times a week    Frequency of Social Gatherings with Friends and Family: Twice a week    Attends Religious Services: Never    Database Administrator or Organizations: No    Attends Banker Meetings: Never    Marital Status: Not on file  Intimate Partner Violence: Not At Risk (03/25/2023)   Humiliation, Afraid, Rape, and Kick questionnaire    Fear of Current or Ex-Partner: No    Emotionally Abused: No    Physically Abused: No    Sexually Abused: No   Social History   Tobacco Use  Smoking Status Former   Types: Cigarettes, E-cigarettes   Passive exposure: Never  Smokeless Tobacco  Never   Social History   Substance and Sexual Activity  Alcohol Use Not Currently   Alcohol/week: 1.0 standard drink of alcohol   Types: 1 Shots of liquor per week   Comment: last use 08/02/23    Family History  Problem Relation Age of Onset  Bipolar disorder Mother    Depression Mother    Gestational diabetes Mother    Seizures Mother    Migraines Mother    Hypertension Father    Depression Father    Heart disease Father    Heart failure Father 3       LVAD   Anxiety disorder Sister    Depression Sister    Sickle cell trait Sister    Healthy Sister    Healthy Sister    Healthy Sister    Sickle cell trait Brother    Healthy Brother    Seizures Maternal Grandmother    Bipolar disorder Maternal Grandmother    Stomach cancer Maternal Grandmother    Healthy Maternal Grandfather    Healthy Paternal Grandmother    Healthy Paternal Grandfather    ROS: Denies fever, fatigue, unexplained weight loss/gain, chest pain, SHOB, and palpitations. Denies neurological deficits, gastrointestinal or genitourinary complaints, and skin changes.   Objective:   Today's Vitals   07/03/24 1005  BP: 127/77  Pulse: 74  Resp: 16  SpO2: 99%  Weight: 156 lb (70.8 kg)  Height: 5' 4 (1.626 m)  PainSc: 0-No pain    GENERAL APPEARANCE: Well-appearing, in NAD. Well nourished.  SKIN: Pink, warm and dry. Turgor normal. No rash, lesion, ulceration, or ecchymoses. Hair evenly distributed.  HEENT: HEAD: Normocephalic.  EYES: PERRLA. EOMI. Lids intact w/o defect. Sclera white, Conjunctiva pink w/o exudate.  EARS: External ear w/o redness, swelling, masses or lesions. EAC clear. TM's intact, translucent w/o bulging, appropriate landmarks visualized. Appropriate acuity to conversational tones.  NOSE: Septum midline w/o deformity. Nares patent, mucosa pink and non-inflamed w/o drainage. No sinus tenderness.  THROAT: Uvula midline. Oropharynx clear. Tonsils non-inflamed w/o exudate. Oral mucosa  pink and moist.  NECK: Supple, Trachea midline. Full ROM w/o pain or tenderness. No lymphadenopathy. Thyroid non-tender w/o enlargement or palpable masses.  BREASTS: Breasts pendulous, symmetrical, and w/o palpable masses. Nipples everted and w/o discharge. No rash or skin retraction. No axillary or supraclavicular lymphadenopathy.  RESPIRATORY: Chest wall symmetrical w/o masses. Respirations even and non-labored. Breath sounds clear to auscultation bilaterally. No wheezes, rales, rhonchi, or crackles. CARDIAC: S1, S2 present, regular rate and rhythm. No gallops, murmurs, rubs, or clicks. PMI w/o lifts, heaves, or thrills. No carotid bruits. Capillary refill <2 seconds. Peripheral pulses 2+ bilaterally. GI: Abdomen soft w/o distention. Normoactive bowel sounds. No palpable masses or tenderness. No guarding or rebound tenderness. Liver and spleen w/o tenderness or enlargement. No CVA tenderness.  GU: External genitalia without erythema, lesions, or masses. No lymphadenopathy. Vaginal mucosa pink and moist without exudate, lesions, or ulcerations. Cervix pink without discharge. Cervical os closed. Uterus and adnexae palpable, not enlarged, and w/o tenderness. No palpable masses.  MSK: Muscle tone and strength appropriate for age, w/o atrophy or abnormal movement.  EXTREMITIES: Active ROM intact, w/o tenderness, crepitus, or contracture. No obvious joint deformities or effusions. No clubbing, edema, or cyanosis.  NEUROLOGIC: CN's II-XII intact. Motor strength symmetrical with no obvious weakness. No sensory deficits. DTR's 2+ symmetric bilaterally. Steady, even gait.  PSYCH/MENTAL STATUS: Alert, oriented x 3. Cooperative, appropriate mood and affect.   Chaperoned by Warren Chiles, CMA   Results for orders placed or performed in visit on 06/11/24  CBC with Differential/Platelet   Collection Time: 06/11/24  9:21 AM  Result Value Ref Range   WBC 6.0 3.4 - 10.8 x10E3/uL   RBC 4.70 3.77 - 5.28 x10E6/uL    Hemoglobin 12.8 11.1 - 15.9 g/dL  Hematocrit 40.2 34.0 - 46.6 %   MCV 86 79 - 97 fL   MCH 27.2 26.6 - 33.0 pg   MCHC 31.8 31.5 - 35.7 g/dL   RDW 85.5 88.2 - 84.5 %   Platelets 341 150 - 450 x10E3/uL   Neutrophils 58 Not Estab. %   Lymphs 31 Not Estab. %   Monocytes 8 Not Estab. %   Eos 2 Not Estab. %   Basos 1 Not Estab. %   Neutrophils Absolute 3.5 1.4 - 7.0 x10E3/uL   Lymphocytes Absolute 1.9 0.7 - 3.1 x10E3/uL   Monocytes Absolute 0.5 0.1 - 0.9 x10E3/uL   EOS (ABSOLUTE) 0.1 0.0 - 0.4 x10E3/uL   Basophils Absolute 0.1 0.0 - 0.2 x10E3/uL   Immature Granulocytes 0 Not Estab. %   Immature Grans (Abs) 0.0 0.0 - 0.1 x10E3/uL  Comprehensive metabolic panel with GFR   Collection Time: 06/11/24  9:21 AM  Result Value Ref Range   Glucose 94 70 - 99 mg/dL   BUN 9 6 - 20 mg/dL   Creatinine, Ser 9.34 0.57 - 1.00 mg/dL   eGFR 874 >40 fO/fpw/8.26   BUN/Creatinine Ratio 14 9 - 23   Sodium 139 134 - 144 mmol/L   Potassium 4.4 3.5 - 5.2 mmol/L   Chloride 103 96 - 106 mmol/L   CO2 19 (L) 20 - 29 mmol/L   Calcium 9.9 8.7 - 10.2 mg/dL   Total Protein 7.6 6.0 - 8.5 g/dL   Albumin 4.8 4.0 - 5.0 g/dL   Globulin, Total 2.8 1.5 - 4.5 g/dL   Bilirubin Total 0.8 0.0 - 1.2 mg/dL   Alkaline Phosphatase 79 41 - 116 IU/L   AST 21 0 - 40 IU/L   ALT 14 0 - 32 IU/L  Hemoglobin A1c   Collection Time: 06/11/24  9:21 AM  Result Value Ref Range   Hgb A1c MFr Bld 5.7 (H) 4.8 - 5.6 %   Est. average glucose Bld gHb Est-mCnc 117 mg/dL  Lipid panel   Collection Time: 06/11/24  9:21 AM  Result Value Ref Range   Cholesterol, Total 151 100 - 199 mg/dL   Triglycerides 64 0 - 149 mg/dL   HDL 49 >60 mg/dL   VLDL Cholesterol Cal 13 5 - 40 mg/dL   LDL Chol Calc (NIH) 89 0 - 99 mg/dL   Chol/HDL Ratio 3.1 0.0 - 4.4 ratio  TSH Rfx on Abnormal to Free T4   Collection Time: 06/11/24  9:21 AM  Result Value Ref Range   TSH 1.140 0.450 - 4.500 uIU/mL  Celiac Ab tTG DGP TIgA   Collection Time: 06/11/24  9:21 AM   Result Value Ref Range   Immunoglobulin A, (IgA) QN, Serum 239 87 - 352 mg/dL   Deamidated Gliadin Abs, IgA 4 0 - 19 units   Deamidated Gliadin Abs, IgG 2 0 - 19 units   t-Transglutaminase (tTG) IgA <2 0 - 3 U/mL   t-Transglutaminase (tTG) IgG 4 0 - 5 U/mL    Assessment & Plan:   1. Wellness examination (Primary) - Encouraged a healthy well-balanced diet. Patient may adjust caloric intake to maintain or achieve ideal body weight. May reduce intake of dietary saturated fat and total fat and have adequate dietary potassium and calcium preferably from fresh fruits, vegetables, and low-fat dairy products.   - Advised to avoid cigarette smoking. - Discussed with the patient that most people either abstain from alcohol or drink within safe limits (<=14/week and <=4 drinks/occasion for males, <=  7/weeks and <= 3 drinks/occasion for females) and that the risk for alcohol disorders and other health effects rises proportionally with the number of drinks per week and how often a drinker exceeds daily limits. - Discussed cessation/primary prevention of drug use and availability of treatment for abuse.  - Discussed sexually transmitted diseases, avoidance of unintended pregnancy and contraceptive alternatives. - Stressed the importance of regular exercise - Injury prevention: Discussed safety belts, safety helmets, smoke detector, smoking near bedding or upholstery.  - Dental health: Discussed importance of regular tooth brushing, flossing, and dental visits.  NEXT PREVENTATIVE PHYSICAL DUE IN 1 YEAR.  2. Screening for cervical cancer Patient is agreeable to procedure today. Clinical breast exam completed with no abnormalities noted. No skin changes, nipple discharge, masses/lumps, or tenderness present. No enlarged axillary lymph nodes palpated. Advised patient to perform breast self-examination monthly during menstrual cycle. Visual inspection of external genitalia of vagina. No inguinal lymphadenopathy  palpated. Vaginal walls and cervix appear pink during speculum examination. Cervical os visualized. Pap completed with no complications. Will update patient with results.   3. Lactose intolerance Discussed negative celiac testing and her ongoing issues with dairy products. Will place referral to GI for further evaluation.  - Ambulatory referral to Gastroenterology  4. Vaginal itching Will perform NuSwab due to vaginal itching.  - NuSwab Vaginitis Plus (VG+)  Return in about 1 year (around 07/03/2025) for Physical with fasting labs.  Patient to reach out to office if new, worrisome, or unresolved symptoms arise or if no improvement in patient's condition. Patient verbalized understanding and is agreeable to treatment plan. All questions answered to patient's satisfaction.   Evalene Arts, FNP

## 2024-07-03 NOTE — Addendum Note (Signed)
 Addended by: Kellan Raffield A on: 07/03/2024 01:15 PM   Modules accepted: Orders

## 2024-07-04 LAB — CYTOLOGY - PAP
Chlamydia: NEGATIVE
Comment: NEGATIVE
Comment: NEGATIVE
Comment: NORMAL
Diagnosis: NEGATIVE
Neisseria Gonorrhea: NEGATIVE
Trichomonas: NEGATIVE

## 2024-07-05 LAB — NUSWAB VAGINITIS PLUS (VG+)
Atopobium vaginae: HIGH {score} — AB
Candida albicans, NAA: POSITIVE — AB
Candida glabrata, NAA: NEGATIVE
Chlamydia trachomatis, NAA: NEGATIVE
Neisseria gonorrhoeae, NAA: NEGATIVE
Trich vag by NAA: NEGATIVE

## 2024-07-06 ENCOUNTER — Ambulatory Visit: Payer: Self-pay | Admitting: Family Medicine

## 2024-07-06 DIAGNOSIS — B9689 Other specified bacterial agents as the cause of diseases classified elsewhere: Secondary | ICD-10-CM

## 2024-07-06 DIAGNOSIS — B3731 Acute candidiasis of vulva and vagina: Secondary | ICD-10-CM

## 2024-07-06 MED ORDER — METRONIDAZOLE 500 MG PO TABS: 500.0000 mg | ORAL_TABLET | Freq: Two times a day (BID) | ORAL | 0 refills | Status: DC

## 2024-07-06 MED ORDER — FLUCONAZOLE 150 MG PO TABS: ORAL_TABLET | ORAL | 0 refills | Status: DC

## 2024-07-09 ENCOUNTER — Other Ambulatory Visit: Payer: Self-pay | Admitting: Family Medicine

## 2024-07-09 DIAGNOSIS — B9689 Other specified bacterial agents as the cause of diseases classified elsewhere: Secondary | ICD-10-CM

## 2024-07-09 DIAGNOSIS — B3731 Acute candidiasis of vulva and vagina: Secondary | ICD-10-CM

## 2024-07-09 MED ORDER — METRONIDAZOLE 500 MG PO TABS: 500.0000 mg | ORAL_TABLET | Freq: Two times a day (BID) | ORAL | 0 refills | Status: AC

## 2024-07-09 MED ORDER — FLUCONAZOLE 150 MG PO TABS: ORAL_TABLET | ORAL | 0 refills | Status: AC

## 2024-07-16 ENCOUNTER — Ambulatory Visit
Admission: EM | Admit: 2024-07-16 | Discharge: 2024-07-16 | Disposition: A | Discharge status: Home / Self Care | Attending: Emergency Medicine | Admitting: Emergency Medicine

## 2024-07-16 ENCOUNTER — Encounter: Payer: Self-pay | Admitting: Emergency Medicine

## 2024-07-16 DIAGNOSIS — J101 Influenza due to other identified influenza virus with other respiratory manifestations: Secondary | ICD-10-CM | POA: Diagnosis not present

## 2024-07-16 MED ORDER — FLUTICASONE PROPIONATE 50 MCG/ACT NA SUSP: 2.0000 | Freq: Every day | NASAL | 0 refills | Status: AC

## 2024-07-16 NOTE — Discharge Instructions (Signed)
 Start Mucinex-D to keep the mucous thin and to decongest you.   You may take 600 mg of motrin  with 1000 mg of tylenol  up to 3-4 times a day as needed for pain. Use a NeilMed sinus rinse with distilled water as often as you want to to reduce nasal congestion. Follow the directions on the box.   Go to www.goodrx.com to look up your medications. This will give you a list of where you can find your prescriptions at the most affordable prices. Or you can ask the pharmacist what the cash price is. This is frequently cheaper than going through insurance.

## 2024-07-16 NOTE — ED Triage Notes (Signed)
 Sx 4 day    Cough fever  Getting better

## 2024-07-16 NOTE — ED Provider Notes (Signed)
 HPI  SUBJECTIVE:  Charlene Combs is a 23 y.o. female who presents with 4 days of nasal congestion, sore throat, rhinorrhea, cough, chest soreness from coughing, feeling feverish with chills.  She states that she is getting much better and that her symptoms have largely resolved.  No sinus pain or pressure, ear pain, wheezing, shortness of breath.  She is sleeping without waking up coughing at night.  She has been taking DayQuil and NyQuil with improvement in her symptoms.  No aggravating factors. She has a past medical history of migraines and febrile seizures.  LMP: Last month.  Denies the possibility of being pregnant.  PCP: [facility].  Of note, both of her daughters are here with her today and have tested positive for influenza A.  Past Medical History:  Diagnosis Date   Anemia affecting pregnancy 02/08/2022   Encounter for induction of labor 05/05/2022   febrile seizures    At age 45   Headache    Migraines   History of migraine headaches    Supervision of other normal pregnancy, antepartum 10/12/2021    Nursing Staff Provider Office Location  [facility] Dating  On 10/19/21@12  5/7=04/28/22 Language  English Anatomy US   On 01/19/22@23  6/7=04/27/22 Flu Vaccine  Declined flu vaccine: 10/12/2021, 04/23/2022 Genetic Screen  NIPS:   AFP:   First Screen:  Quad:11/09/21=neg   TDaP vaccine   02/08/2022 Hgb A1C or  GTT Early  Third trimester  =  118     (02/08/22) COVID vaccine Declines, 10/12/2021   Rhogam     LAB RESUL    Past Surgical History:  Procedure Laterality Date   NO PAST SURGERIES      Family History  Problem Relation Age of Onset   Bipolar disorder Mother    Depression Mother    Gestational diabetes Mother    Seizures Mother    Migraines Mother    Hypertension Father    Depression Father    Heart disease Father    Heart failure Father 75       LVAD   Lactose intolerance Father    Anxiety disorder Sister    Depression Sister    Sickle cell trait Sister    Lactose intolerance  Sister    Healthy Sister    Healthy Sister    Healthy Sister    Sickle cell trait Brother    Healthy Brother    Seizures Maternal Grandmother    Bipolar disorder Maternal Grandmother    Stomach cancer Maternal Grandmother    Healthy Maternal Grandfather    Healthy Paternal Grandmother    Healthy Paternal Grandfather     Social History[1]  Current Medications[2]  Allergies[3]   ROS  As noted in HPI.   Physical Exam  BP 121/86 (BP Location: Right Arm)   Pulse 72   Temp 98.4 F (36.9 C) (Oral)   Resp 17   Wt 70.8 kg   LMP 07/02/2024   SpO2 100%   BMI 26.78 kg/m   Constitutional: Well developed, well nourished, no acute distress Eyes: PERRL, EOMI, conjunctiva normal bilaterally HENT: Normocephalic, atraumatic,mucus membranes moist.  Positive clear nasal congestion.  Erythematous, swollen turbinates.  No maxillary, frontal sinus tenderness.  Normal oropharynx.  Normal tonsils without exudates.  Uvula midline. Neck: No cervical lymphadenopathy Respiratory: Clear to auscultation bilaterally, no rales, no wheezing, no rhonchi Cardiovascular: Normal rate and rhythm, no murmurs, no gallops, no rubs GI: nondistended skin: No rash, skin intact Musculoskeletal: no deformities Neurologic: Alert & oriented  x 3, CN III-XII grossly intact, no motor deficits, sensation grossly intact Psychiatric: Speech and behavior appropriate   ED Course   Medications - No data to display  No orders of the defined types were placed in this encounter.  No results found for this or any previous visit (from the past 24 hours). No results found.  ED Clinical Impression  1. Influenza A      ED Assessment/Plan     Both of her children tested positive for influenza A.  I suspect that that is what the patient had, however she is out of the treatment window for Tamiflu.  She states that she is getting significantly better.  There is no evidence of secondary bacterial infection.  Home  with Tylenol /ibuprofen , Flonase , saline nasal irrigation, Mucinex D.  Follow-up with PCP as needed.  Discussed MDM, treatment plan, and plan for follow-up with patient. patient agrees with plan.   Meds ordered this encounter  Medications   fluticasone  (FLONASE ) 50 MCG/ACT nasal spray    Sig: Place 2 sprays into both nostrils daily.    Dispense:  16 g    Refill:  0      *This clinic note was created using Scientist, clinical (histocompatibility and immunogenetics). Therefore, there may be occasional mistakes despite careful proofreading. ?      [1]  Social History Tobacco Use   Smoking status: Former    Types: Cigarettes, E-cigarettes    Passive exposure: Never   Smokeless tobacco: Never  Vaping Use   Vaping status: Every Day   Substances: Nicotine  Substance Use Topics   Alcohol use: Not Currently    Alcohol/week: 1.0 standard drink of alcohol    Types: 1 Shots of liquor per week    Comment: occ.   Drug use: Not Currently    Types: Marijuana    Comment: stopped 08/25/2021 when found out preg, smoking mj 1-2 x /day  [2] No current facility-administered medications for this encounter.  Current Outpatient Medications:    fluticasone  (FLONASE ) 50 MCG/ACT nasal spray, Place 2 sprays into both nostrils daily., Disp: 16 g, Rfl: 0   fluconazole  (DIFLUCAN ) 150 MG tablet, Take 1 tablet (150mg ) once after completion of Flagyl . If symptoms still persist, take another 1 tablet (150mg ) 72 hours after the first dose., Disp: 2 tablet, Rfl: 0   metroNIDAZOLE  (FLAGYL ) 500 MG tablet, Take 1 tablet (500 mg total) by mouth 2 (two) times daily for 7 days., Disp: 14 tablet, Rfl: 0 [3] No Known Allergies    Van Knee, MD 07/16/24 1506
